# Patient Record
Sex: Female | Born: 1970 | Race: Black or African American | Hispanic: No | Smoking: Never smoker
Health system: Southern US, Community
[De-identification: ages and names within clinical notes are randomized; demographics above are authoritative.]

## PROBLEM LIST (undated history)

## (undated) DIAGNOSIS — I10 Essential (primary) hypertension: Secondary | ICD-10-CM

## (undated) DIAGNOSIS — D649 Anemia, unspecified: Secondary | ICD-10-CM

## (undated) HISTORY — PX: CARPAL TUNNEL RELEASE: SHX101

## (undated) HISTORY — PX: ABDOMINAL HYSTERECTOMY: SHX81

---

## 2009-03-14 ENCOUNTER — Inpatient Hospital Stay (HOSPITAL_COMMUNITY): Admission: AD | Admit: 2009-03-14 | Discharge: 2009-03-14 | Payer: Self-pay | Admitting: Obstetrics and Gynecology

## 2009-05-07 ENCOUNTER — Ambulatory Visit: Payer: Self-pay | Admitting: Obstetrics and Gynecology

## 2009-05-08 ENCOUNTER — Encounter: Payer: Self-pay | Admitting: Family

## 2009-05-08 ENCOUNTER — Ambulatory Visit (HOSPITAL_COMMUNITY): Admission: RE | Admit: 2009-05-08 | Discharge: 2009-05-08 | Payer: Self-pay | Admitting: Family Medicine

## 2009-05-08 ENCOUNTER — Ambulatory Visit: Payer: Self-pay | Admitting: Obstetrics and Gynecology

## 2009-05-08 LAB — CONVERTED CEMR LAB
HCT: 32.2 % — ABNORMAL LOW (ref 36.0–46.0)
Hemoglobin: 9.7 g/dL — ABNORMAL LOW (ref 12.0–15.0)
MCHC: 30.1 g/dL (ref 30.0–36.0)
MCV: 78.5 fL (ref 78.0–100.0)

## 2009-06-03 ENCOUNTER — Ambulatory Visit: Payer: Self-pay | Admitting: Obstetrics and Gynecology

## 2009-06-03 ENCOUNTER — Ambulatory Visit (HOSPITAL_COMMUNITY): Admission: RE | Admit: 2009-06-03 | Discharge: 2009-06-03 | Payer: Self-pay | Admitting: Obstetrics and Gynecology

## 2009-06-20 ENCOUNTER — Ambulatory Visit: Payer: Self-pay | Admitting: Obstetrics and Gynecology

## 2009-08-25 ENCOUNTER — Ambulatory Visit: Payer: Self-pay | Admitting: Physician Assistant

## 2009-08-25 ENCOUNTER — Inpatient Hospital Stay (HOSPITAL_COMMUNITY): Admission: AD | Admit: 2009-08-25 | Discharge: 2009-08-25 | Payer: Self-pay | Admitting: Obstetrics & Gynecology

## 2009-09-19 ENCOUNTER — Encounter: Payer: Self-pay | Admitting: Family

## 2009-09-19 ENCOUNTER — Ambulatory Visit: Payer: Self-pay | Admitting: Obstetrics and Gynecology

## 2009-09-19 LAB — CONVERTED CEMR LAB
RDW: 16.4 % — ABNORMAL HIGH (ref 11.5–15.5)
WBC: 4.1 10*3/uL (ref 4.0–10.5)

## 2009-12-05 ENCOUNTER — Ambulatory Visit: Payer: Self-pay | Admitting: Obstetrics & Gynecology

## 2010-02-20 ENCOUNTER — Ambulatory Visit: Payer: Self-pay | Admitting: Obstetrics and Gynecology

## 2010-04-09 ENCOUNTER — Ambulatory Visit (HOSPITAL_COMMUNITY): Admission: RE | Admit: 2010-04-09 | Discharge: 2010-04-09 | Payer: Self-pay | Admitting: Family Medicine

## 2010-05-08 ENCOUNTER — Ambulatory Visit: Payer: Self-pay | Admitting: Obstetrics & Gynecology

## 2010-07-31 ENCOUNTER — Ambulatory Visit
Admission: RE | Admit: 2010-07-31 | Discharge: 2010-07-31 | Payer: Self-pay | Source: Home / Self Care | Attending: Obstetrics & Gynecology | Admitting: Obstetrics & Gynecology

## 2010-09-07 ENCOUNTER — Encounter (HOSPITAL_COMMUNITY)
Admission: RE | Admit: 2010-09-07 | Discharge: 2010-09-07 | Disposition: A | Discharge status: Home / Self Care | Payer: Self-pay | Source: Ambulatory Visit | Attending: Obstetrics & Gynecology | Admitting: Obstetrics & Gynecology

## 2010-09-07 DIAGNOSIS — Z01810 Encounter for preprocedural cardiovascular examination: Secondary | ICD-10-CM | POA: Insufficient documentation

## 2010-09-07 DIAGNOSIS — Z01812 Encounter for preprocedural laboratory examination: Secondary | ICD-10-CM | POA: Insufficient documentation

## 2010-09-07 LAB — BASIC METABOLIC PANEL
CO2: 29 mEq/L (ref 19–32)
Creatinine, Ser: 1.13 mg/dL (ref 0.4–1.2)
Glucose, Bld: 83 mg/dL (ref 70–99)
Potassium: 3.1 mEq/L — ABNORMAL LOW (ref 3.5–5.1)

## 2010-09-07 LAB — CBC
HCT: 40 % (ref 36.0–46.0)
MCH: 27.6 pg (ref 26.0–34.0)
MCHC: 32 g/dL (ref 30.0–36.0)
Platelets: 159 10*3/uL (ref 150–400)
RBC: 4.64 MIL/uL (ref 3.87–5.11)
RDW: 14.1 % (ref 11.5–15.5)
WBC: 3.6 10*3/uL — ABNORMAL LOW (ref 4.0–10.5)

## 2010-09-07 LAB — SURGICAL PCR SCREEN: MRSA, PCR: NEGATIVE

## 2010-09-14 ENCOUNTER — Other Ambulatory Visit: Payer: Self-pay | Admitting: Obstetrics & Gynecology

## 2010-09-14 ENCOUNTER — Inpatient Hospital Stay (HOSPITAL_COMMUNITY)
Admission: RE | Admit: 2010-09-14 | Discharge: 2010-09-16 | DRG: 743 | Disposition: A | Discharge status: Home Health | Payer: Medicaid Other | Source: Ambulatory Visit | Attending: Obstetrics & Gynecology | Admitting: Obstetrics & Gynecology

## 2010-09-14 DIAGNOSIS — N949 Unspecified condition associated with female genital organs and menstrual cycle: Secondary | ICD-10-CM

## 2010-09-14 DIAGNOSIS — D252 Subserosal leiomyoma of uterus: Secondary | ICD-10-CM

## 2010-09-14 DIAGNOSIS — N803 Endometriosis of pelvic peritoneum, unspecified: Principal | ICD-10-CM | POA: Diagnosis present

## 2010-09-14 DIAGNOSIS — N8300 Follicular cyst of ovary, unspecified side: Secondary | ICD-10-CM | POA: Diagnosis present

## 2010-09-14 DIAGNOSIS — D251 Intramural leiomyoma of uterus: Secondary | ICD-10-CM | POA: Diagnosis present

## 2010-09-14 DIAGNOSIS — D250 Submucous leiomyoma of uterus: Secondary | ICD-10-CM | POA: Diagnosis present

## 2010-09-15 LAB — CBC
MCH: 28.1 pg (ref 26.0–34.0)
MCHC: 32.8 g/dL (ref 30.0–36.0)
RBC: 3.38 MIL/uL — ABNORMAL LOW (ref 3.87–5.11)
WBC: 7.7 10*3/uL (ref 4.0–10.5)

## 2010-10-07 LAB — WET PREP, GENITAL: Clue Cells Wet Prep HPF POC: NONE SEEN

## 2010-10-07 LAB — URINALYSIS, ROUTINE W REFLEX MICROSCOPIC
Glucose, UA: NEGATIVE mg/dL
Ketones, ur: NEGATIVE mg/dL
Nitrite: NEGATIVE
Specific Gravity, Urine: <1.005 — ABNORMAL LOW (ref 1.005–1.030)

## 2010-10-07 LAB — CBC
Hemoglobin: 8.8 g/dL — ABNORMAL LOW (ref 12.0–15.0)
MCHC: 32.2 g/dL (ref 30.0–36.0)
RBC: 3.41 MIL/uL — ABNORMAL LOW (ref 3.87–5.11)

## 2010-10-07 LAB — URINE MICROSCOPIC-ADD ON

## 2010-10-07 LAB — POCT PREGNANCY, URINE: Preg Test, Ur: NEGATIVE

## 2010-10-15 ENCOUNTER — Ambulatory Visit: Payer: Self-pay | Admitting: Physician Assistant

## 2010-10-15 ENCOUNTER — Ambulatory Visit: Payer: Self-pay | Admitting: Obstetrics & Gynecology

## 2010-10-15 DIAGNOSIS — Z09 Encounter for follow-up examination after completed treatment for conditions other than malignant neoplasm: Secondary | ICD-10-CM

## 2010-10-16 NOTE — Op Note (Signed)
Sandra Cunningham, Sandra Cunningham               ACCOUNT NO.:  [id number]  MEDICAL RECORD NO.:  [id number]           PATIENT TYPE:  I  LOCATION:  9319                          FACILITY:  [facility]  PHYSICIAN:  Allie Bossier, MD        DATE OF BIRTH:  1970-11-01  DATE OF PROCEDURE:  09/14/2010 DATE OF DISCHARGE:                              OPERATIVE REPORT   PREOPERATIVE DIAGNOSIS:  Endometriosis and chronic pelvic pain.  POSTOPERATIVE DIAGNOSIS:  Endometriosis and chronic pelvic pain.  PROCEDURE:  TAH left salpingo-oophorectomy.  SURGEON:  Sun Wilensky C. Marice Potter, MD  ASSISTANT:  Scheryl Darter, MD  ANESTHESIA:  GETA.  COMPLICATIONS:  None.  ESTIMATED BLOOD LOSS:  Less than 200 mL.  SPECIMENS:  Uterus with cervix and left ovary and tube.  FINDINGS:  Normal pelvis with the exception of some moderately dense bladder flap adhesions, absence of right adnexa.  DETAILED PROCEDURE AND FINDINGS:  Risks, benefits, alternatives of surgery were explained, understood, and accepted.  Consents were signed. She was taken to the operating room, general anesthesia was applied. Her vagina and abdomen were prepped and draped in the usual sterile fashion.  Foley catheter was placed which drained clear urine throughout the case.  A transverse incision was made at the site of her previous laparotomy.  Incision was carried down through the subcutaneous tissue of the fascia.  Fascia was scored in the midline.  Fascial incision was extended bilaterally and curved slightly upwards.  The middle 50% of the rectus muscles were separated in transverse fashion using electrosurgical technique.  Excellent hemostasis was maintained.  The peritoneum was entered with hemostats.  Peritoneal incision was extended with the Bovie bilaterally taking care to avoid the bowel.  The patient was placed in Trendelenburg position and her bowel was packed out of the abdominal cavity and O'Connor-O'Sullivan self-retaining retractor was placed.   Excellent visualization was obtained.  The ureters positions were noted bilaterally.  They were functioning well throughout the case and noted to be well away from the operative site.  The uterus was placed on traction by using Kocher clamps across the uterosacral ligaments bilaterally.  The round ligaments were each identified, clamped, cut, and ligated.  A bladder flap was created anteriorly, I had to do some lysis of adhesions there, but the bladder well taking care of urine in this case.  The utero-ovarian ligament on the right was identified, clamped, cut, and doubly ligated.  Please note that 2-0 Vicryl sutures used throughout this case unless otherwise specified. The left infundibulopelvic ligament was identified.  Ureters position was again noted.  The infundibulopelvic ligament was then clamped, cut, and doubly ligated.  Uterine vessels were skeletonized, clamped, cut, and doubly ligated.  Remainder of the relatively short cervix attachment to the pelvis were separated in a clamp, cut, and ligate technique.  The vagina was entered.  The cervix was removed and noted to be intact.  The vaginal cuff was closed with a 0 Vicryl running locking suture. Excellent hemostasis was noted.  The pelvis was irrigated with a liter of warm normal saline.  There was a very small amount  of oozing from the right ovarian fossa in the adventitia.  There was nothing specific that could be cauterized and it was clearly not going to cause hemodynamic difficulty.  I placed a Gelfoam across the ovarian fossa.  I removed the sponges and self-retaining retractor and closed the fascia with a #1 PDS loop in a running nonlocking fashion.  No defects were palpable.  The subcutaneous tissue was irrigated, cleaned, and dried.  It was then infiltrated with 30 mL of 0.5% Marcaine.  A subcuticular closure was done with 3-0 Vicryl suture.  Her Foley catheter drained clear urine throughout.  She was extubated and taken  to recovery room in stable condition.     Allie Bossier, MD     MCD/MEDQ  D:  09/14/2010  T:  09/15/2010  Job:  [id number]  Electronically Signed by Nicholaus Bloom MD on 10/16/2010 11:26:01 AM

## 2010-10-21 LAB — BASIC METABOLIC PANEL
BUN: 14 mg/dL (ref 6–23)
CO2: 29 mEq/L (ref 19–32)
Calcium: 8.6 mg/dL (ref 8.4–10.5)
Creatinine, Ser: 0.84 mg/dL (ref 0.4–1.2)
GFR calc Af Amer: >60 mL/min (ref >60)
GFR calc non Af Amer: >60 mL/min (ref >60)

## 2010-10-21 LAB — CBC
HCT: 31.5 % — ABNORMAL LOW (ref 36.0–46.0)
MCHC: 32.2 g/dL (ref 30.0–36.0)
Platelets: 195 10*3/uL (ref 150–400)
RBC: 4.01 MIL/uL (ref 3.87–5.11)
WBC: 3.5 10*3/uL — ABNORMAL LOW (ref 4.0–10.5)

## 2010-10-24 LAB — CBC
HCT: 27.4 % — ABNORMAL LOW (ref 36.0–46.0)
Hemoglobin: 9 g/dL — ABNORMAL LOW (ref 12.0–15.0)
MCV: 80.9 fL (ref 78.0–100.0)
RBC: 3.39 MIL/uL — ABNORMAL LOW (ref 3.87–5.11)
RDW: 16.8 % — ABNORMAL HIGH (ref 11.5–15.5)

## 2010-10-24 LAB — URINALYSIS, ROUTINE W REFLEX MICROSCOPIC
Hgb urine dipstick: NEGATIVE
Ketones, ur: NEGATIVE mg/dL
Nitrite: NEGATIVE
Urobilinogen, UA: 1 mg/dL (ref 0.0–1.0)

## 2010-10-24 LAB — POCT PREGNANCY, URINE: Preg Test, Ur: NEGATIVE

## 2010-10-24 LAB — URINE MICROSCOPIC-ADD ON

## 2010-10-24 LAB — URINE CULTURE

## 2010-10-24 LAB — RAPID URINE DRUG SCREEN, HOSP PERFORMED: Barbiturates: NOT DETECTED

## 2010-12-23 ENCOUNTER — Ambulatory Visit: Payer: Self-pay | Admitting: Advanced Practice Midwife

## 2011-01-06 ENCOUNTER — Ambulatory Visit: Payer: Self-pay | Admitting: Family Medicine

## 2011-07-21 ENCOUNTER — Encounter: Payer: Self-pay | Admitting: *Deleted

## 2011-07-21 ENCOUNTER — Emergency Department (HOSPITAL_BASED_OUTPATIENT_CLINIC_OR_DEPARTMENT_OTHER)
Admission: EM | Admit: 2011-07-21 | Discharge: 2011-07-21 | Disposition: A | Discharge status: Home / Self Care | Payer: Self-pay | Attending: Emergency Medicine | Admitting: Emergency Medicine

## 2011-07-21 DIAGNOSIS — M5430 Sciatica, unspecified side: Secondary | ICD-10-CM | POA: Insufficient documentation

## 2011-07-21 DIAGNOSIS — I10 Essential (primary) hypertension: Secondary | ICD-10-CM | POA: Insufficient documentation

## 2011-07-21 DIAGNOSIS — M549 Dorsalgia, unspecified: Secondary | ICD-10-CM | POA: Insufficient documentation

## 2011-07-21 HISTORY — DX: Essential (primary) hypertension: I10

## 2011-07-21 HISTORY — DX: Anemia, unspecified: D64.9

## 2011-07-21 MED ORDER — CYCLOBENZAPRINE HCL 5 MG PO TABS: 5.0000 mg | ORAL_TABLET | Freq: Two times a day (BID) | ORAL | Status: AC | PRN

## 2011-07-21 MED ORDER — HYDROCODONE-ACETAMINOPHEN 5-500 MG PO TABS: 1.0000 | ORAL_TABLET | Freq: Four times a day (QID) | ORAL | Status: AC | PRN

## 2011-07-21 NOTE — ED Notes (Signed)
Low back pain onset 2 months ago thinks she may have pulled a muscle then but is getting worse now has pain radiating into buttocks and feeling of pins and needles into legs

## 2011-07-21 NOTE — ED Provider Notes (Signed)
History     CSN: [id number]  Arrival date & time 07/21/11  4098   First MD Initiated Contact with Patient 07/21/11 1955      Chief Complaint  Patient presents with  . Back Pain    (Consider location/radiation/quality/duration/timing/severity/associated sxs/prior treatment) HPI Comments: Pt state that she has pain in her right buttocks that radiates down her leg:pt denies any know injury  Patient is a 41 y.o. female presenting with back pain. The history is provided by the patient. No language interpreter was used.  Back Pain  This is a new problem. The current episode started more than 1 week ago. The problem occurs daily. The problem has not changed since onset.The pain is associated with no known injury. The pain is present in the lumbar spine. The quality of the pain is described as shooting. The pain radiates to the right thigh. The pain is severe. The symptoms are aggravated by certain positions. The pain is worse during the day. Pertinent negatives include no numbness, no bowel incontinence, no perianal numbness, no bladder incontinence, no tingling and no weakness. She has tried nothing for the symptoms.    Past Medical History  Diagnosis Date  . Hypertension   . Anemia     Past Surgical History  Procedure Date  . Abdominal hysterectomy     History reviewed. No pertinent family history.  History  Substance Use Topics  . Smoking status: Never Smoker   . Smokeless tobacco: Not on file  . Alcohol Use: No    OB History    Grav Para Term Preterm Abortions TAB SAB Ect Mult Living                  Review of Systems  Gastrointestinal: Negative for bowel incontinence.  Genitourinary: Negative for bladder incontinence.  Musculoskeletal: Positive for back pain.  Neurological: Negative for tingling, weakness and numbness.  All other systems reviewed and are negative.    Allergies  Review of patient's allergies indicates no known allergies.  Home Medications    Current Outpatient Rx  Name Route Sig Dispense Refill  . ESOMEPRAZOLE MAGNESIUM 40 MG PO CPDR Oral Take 40 mg by mouth 2 (two) times daily.      Marland Kitchen HYDROCHLOROTHIAZIDE 25 MG PO TABS Oral Take 25 mg by mouth daily.      . IBUPROFEN 200 MG PO TABS Oral Take 800 mg by mouth daily.      Marland Kitchen LISINOPRIL 5 MG PO TABS Oral Take 5 mg by mouth daily.        BP 160/106  Pulse 70  Temp(Src) 98.6 F (37 C) (Oral)  Resp 20  SpO2 100%  Physical Exam  Nursing note and vitals reviewed. Constitutional: She is oriented to person, place, and time. She appears well-developed and well-nourished.  HENT:  Head: Normocephalic and atraumatic.  Cardiovascular: Normal rate and regular rhythm.   Pulmonary/Chest: Effort normal and breath sounds normal.  Musculoskeletal: Normal range of motion.       Pt has right sciatic notch tenderness  Neurological: She is alert and oriented to person, place, and time. No cranial nerve deficit. She exhibits normal muscle tone. Coordination normal.  Skin: Skin is warm and dry.    ED Course  Procedures (including critical care time)  Labs Reviewed - No data to display No results found.   1. Sciatica       MDM  Pt not having any neuro deficit will treat symptomatically  Teressa Lower, NP 07/21/11 2025

## 2011-07-21 NOTE — ED Provider Notes (Signed)
Medical screening examination/treatment/procedure(s) were performed by non-physician practitioner and as supervising physician I was immediately available for consultation/collaboration.   Aveyah Greenwood, MD 07/21/11 2306 

## 2013-04-16 ENCOUNTER — Emergency Department (HOSPITAL_BASED_OUTPATIENT_CLINIC_OR_DEPARTMENT_OTHER)
Admission: EM | Admit: 2013-04-16 | Discharge: 2013-04-16 | Disposition: A | Discharge status: Home / Self Care | Payer: Medicaid Other | Attending: Emergency Medicine | Admitting: Emergency Medicine

## 2013-04-16 ENCOUNTER — Encounter (HOSPITAL_BASED_OUTPATIENT_CLINIC_OR_DEPARTMENT_OTHER): Payer: Self-pay

## 2013-04-16 DIAGNOSIS — I10 Essential (primary) hypertension: Secondary | ICD-10-CM | POA: Insufficient documentation

## 2013-04-16 DIAGNOSIS — M545 Low back pain, unspecified: Secondary | ICD-10-CM | POA: Insufficient documentation

## 2013-04-16 DIAGNOSIS — Z862 Personal history of diseases of the blood and blood-forming organs and certain disorders involving the immune mechanism: Secondary | ICD-10-CM | POA: Insufficient documentation

## 2013-04-16 DIAGNOSIS — Z79899 Other long term (current) drug therapy: Secondary | ICD-10-CM | POA: Insufficient documentation

## 2013-04-16 DIAGNOSIS — M549 Dorsalgia, unspecified: Secondary | ICD-10-CM

## 2013-04-16 MED ORDER — MELOXICAM 7.5 MG PO TABS: 7.5000 mg | ORAL_TABLET | Freq: Every day | ORAL | Status: DC

## 2013-04-16 MED ORDER — PREDNISONE 10 MG PO TABS: ORAL_TABLET | ORAL | Status: DC

## 2013-04-16 MED ORDER — HYDROCODONE-ACETAMINOPHEN 5-325 MG PO TABS: 2.0000 | ORAL_TABLET | ORAL | Status: DC | PRN

## 2013-04-16 NOTE — ED Notes (Signed)
Pt has back pain x 2 months and states back pain has increased the past 2 days.

## 2013-04-16 NOTE — ED Provider Notes (Signed)
CSN: [id number]     Arrival date & time 04/16/13  1744 History   First MD Initiated Contact with Patient 04/16/13 1939     Chief Complaint  Patient presents with  . Back Pain   (Consider location/radiation/quality/duration/timing/severity/associated sxs/prior Treatment) Patient is a 42 y.o. female presenting with back pain. The history is provided by the patient. No language interpreter was used.  Back Pain Location:  Lumbar spine Quality:  Aching Pain severity:  Moderate Onset quality:  Sudden Timing:  Constant Progression:  Worsening Chronicity:  New Relieved by:  Nothing Worsened by:  Nothing tried Ineffective treatments:  None tried Associated symptoms: no abdominal pain     Past Medical History  Diagnosis Date  . Hypertension   . Anemia    Past Surgical History  Procedure Laterality Date  . Abdominal hysterectomy     No family history on file. History  Substance Use Topics  . Smoking status: Never Smoker   . Smokeless tobacco: Not on file  . Alcohol Use: No   OB History   Grav Para Term Preterm Abortions TAB SAB Ect Mult Living                 Review of Systems  Gastrointestinal: Negative for abdominal pain.  Musculoskeletal: Positive for back pain.  All other systems reviewed and are negative.    Allergies  Review of patient's allergies indicates no known allergies.  Home Medications   Current Outpatient Rx  Name  Route  Sig  Dispense  Refill  . hydrochlorothiazide (HYDRODIURIL) 25 MG tablet   Oral   Take 25 mg by mouth daily.         Marland Kitchen omeprazole (PRILOSEC) 20 MG capsule   Oral   Take 20 mg by mouth daily.          BP 167/101  Pulse 70  Temp(Src) 99 F (37.2 C) (Oral)  Resp 17  Ht 5\' 5"  (1.651 m)  Wt 194 lb (87.998 kg)  BMI 32.28 kg/m2  SpO2 100% Physical Exam  Nursing note and vitals reviewed. Constitutional: She appears well-developed and well-nourished.  Cardiovascular: Normal rate.   Pulmonary/Chest: Effort normal.   Abdominal: Soft.  Musculoskeletal: Normal range of motion.  Neurological: She is alert.  Skin: Skin is warm.  Psychiatric: She has a normal mood and affect.    ED Course  Procedures (including critical care time) Labs Review Labs Reviewed - No data to display Imaging Review No results found.  MDM   1. Back pain    Meloxicam,  Prednisone and hydrocodone.   Pt advised to follow up with Dr. Pearletha Forge for recheck   Elson Areas, PA-C 04/17/13 0040

## 2013-04-17 NOTE — ED Provider Notes (Signed)
History/physical exam/procedure(s) were performed by non-physician practitioner and as supervising physician I was immediately available for consultation/collaboration. I have reviewed all notes and am in agreement with care and plan.   Hilario Quarry, MD 04/17/13 [phone]

## 2014-04-23 ENCOUNTER — Encounter (HOSPITAL_BASED_OUTPATIENT_CLINIC_OR_DEPARTMENT_OTHER): Payer: Self-pay | Admitting: Emergency Medicine

## 2014-04-23 ENCOUNTER — Emergency Department (HOSPITAL_BASED_OUTPATIENT_CLINIC_OR_DEPARTMENT_OTHER)
Admission: EM | Admit: 2014-04-23 | Discharge: 2014-04-24 | Disposition: A | Discharge status: Home / Self Care | Payer: No Typology Code available for payment source | Attending: Emergency Medicine | Admitting: Emergency Medicine

## 2014-04-23 ENCOUNTER — Emergency Department (HOSPITAL_BASED_OUTPATIENT_CLINIC_OR_DEPARTMENT_OTHER): Payer: No Typology Code available for payment source

## 2014-04-23 DIAGNOSIS — Z791 Long term (current) use of non-steroidal anti-inflammatories (NSAID): Secondary | ICD-10-CM | POA: Diagnosis not present

## 2014-04-23 DIAGNOSIS — Y9389 Activity, other specified: Secondary | ICD-10-CM | POA: Insufficient documentation

## 2014-04-23 DIAGNOSIS — Z79899 Other long term (current) drug therapy: Secondary | ICD-10-CM | POA: Diagnosis not present

## 2014-04-23 DIAGNOSIS — I10 Essential (primary) hypertension: Secondary | ICD-10-CM | POA: Insufficient documentation

## 2014-04-23 DIAGNOSIS — S199XXA Unspecified injury of neck, initial encounter: Secondary | ICD-10-CM | POA: Diagnosis present

## 2014-04-23 DIAGNOSIS — Z862 Personal history of diseases of the blood and blood-forming organs and certain disorders involving the immune mechanism: Secondary | ICD-10-CM | POA: Diagnosis not present

## 2014-04-23 DIAGNOSIS — S3982XA Other specified injuries of lower back, initial encounter: Secondary | ICD-10-CM | POA: Diagnosis not present

## 2014-04-23 DIAGNOSIS — S139XXA Sprain of joints and ligaments of unspecified parts of neck, initial encounter: Secondary | ICD-10-CM | POA: Insufficient documentation

## 2014-04-23 DIAGNOSIS — E041 Nontoxic single thyroid nodule: Secondary | ICD-10-CM | POA: Insufficient documentation

## 2014-04-23 DIAGNOSIS — Y92410 Unspecified street and highway as the place of occurrence of the external cause: Secondary | ICD-10-CM | POA: Diagnosis not present

## 2014-04-23 DIAGNOSIS — Z7952 Long term (current) use of systemic steroids: Secondary | ICD-10-CM | POA: Diagnosis not present

## 2014-04-23 MED ORDER — KETOROLAC TROMETHAMINE 60 MG/2ML IM SOLN
60.0000 mg | Freq: Once | INTRAMUSCULAR | Status: AC
  Administered 2014-04-23: 60 mg via INTRAMUSCULAR
  Filled 2014-04-23: qty 2
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
## 2014-04-23 NOTE — ED Notes (Signed)
MD at bedside. 

## 2014-04-24 MED ORDER — HYDROCODONE-ACETAMINOPHEN 5-325 MG PO TABS: 1.0000 | ORAL_TABLET | Freq: Four times a day (QID) | ORAL | Status: DC | PRN
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
## 2016-05-30 ENCOUNTER — Emergency Department (HOSPITAL_BASED_OUTPATIENT_CLINIC_OR_DEPARTMENT_OTHER)
Admission: EM | Admit: 2016-05-30 | Discharge: 2016-05-30 | Disposition: A | Discharge status: Home / Self Care | Payer: BLUE CROSS/BLUE SHIELD | Attending: Emergency Medicine | Admitting: Emergency Medicine

## 2016-05-30 ENCOUNTER — Encounter (HOSPITAL_BASED_OUTPATIENT_CLINIC_OR_DEPARTMENT_OTHER): Payer: Self-pay | Admitting: *Deleted

## 2016-05-30 ENCOUNTER — Emergency Department (HOSPITAL_BASED_OUTPATIENT_CLINIC_OR_DEPARTMENT_OTHER): Payer: BLUE CROSS/BLUE SHIELD

## 2016-05-30 DIAGNOSIS — R05 Cough: Secondary | ICD-10-CM | POA: Insufficient documentation

## 2016-05-30 DIAGNOSIS — Z79899 Other long term (current) drug therapy: Secondary | ICD-10-CM | POA: Diagnosis not present

## 2016-05-30 DIAGNOSIS — I10 Essential (primary) hypertension: Secondary | ICD-10-CM | POA: Diagnosis not present

## 2016-05-30 DIAGNOSIS — R5383 Other fatigue: Secondary | ICD-10-CM | POA: Insufficient documentation

## 2016-05-30 LAB — URINALYSIS, ROUTINE W REFLEX MICROSCOPIC
Bilirubin Urine: NEGATIVE
Glucose, UA: NEGATIVE mg/dL
HGB URINE DIPSTICK: NEGATIVE
Ketones, ur: NEGATIVE mg/dL
NITRITE: NEGATIVE
PROTEIN: NEGATIVE mg/dL
SPECIFIC GRAVITY, URINE: 1.026 (ref 1.005–1.030)
pH: 7 (ref 5.0–8.0)

## 2016-05-30 LAB — CBC
HCT: 35.6 % — ABNORMAL LOW (ref 36.0–46.0)
Hemoglobin: 11.6 g/dL — ABNORMAL LOW (ref 12.0–15.0)
MCH: 28.3 pg (ref 26.0–34.0)
MCHC: 32.6 g/dL (ref 30.0–36.0)
MCV: 86.8 fL (ref 78.0–100.0)
PLATELETS: 207 10*3/uL (ref 150–400)
RBC: 4.1 MIL/uL (ref 3.87–5.11)
RDW: 15.4 % (ref 11.5–15.5)
WBC: 5 10*3/uL (ref 4.0–10.5)

## 2016-05-30 LAB — URINE MICROSCOPIC-ADD ON

## 2016-05-30 LAB — BASIC METABOLIC PANEL
Anion gap: 6 (ref 5–15)
BUN: 25 mg/dL — ABNORMAL HIGH (ref 6–20)
CALCIUM: 8.7 mg/dL — AB (ref 8.9–10.3)
CO2: 29 mmol/L (ref 22–32)
CREATININE: 1.05 mg/dL — AB (ref 0.44–1.00)
Chloride: 106 mmol/L (ref 101–111)
GFR calc non Af Amer: >60 mL/min (ref >60)
Glucose, Bld: 102 mg/dL — ABNORMAL HIGH (ref 65–99)
Potassium: 3.6 mmol/L (ref 3.5–5.1)
Sodium: 141 mmol/L (ref 135–145)

## 2016-05-30 LAB — PREGNANCY, URINE: PREG TEST UR: NEGATIVE

## 2016-05-30 LAB — CBG MONITORING, ED: Glucose-Capillary: 93 mg/dL (ref 65–99)

## 2016-05-30 NOTE — ED Notes (Signed)
Pt c/o general fatigue x3 days. Pt reports dry cough x 1 month. Pt denies fever, N/V/D. Pt had recent travel to the [place] that included a long car ride to [facility]. Pt also states she had some burning with urination 1 week ago, but has since cleared up.

## 2016-05-30 NOTE — ED Triage Notes (Addendum)
Increased fatigue since Monday.  Requesting to have CBG

## 2016-05-30 NOTE — ED Provider Notes (Signed)
[facility] Provider Note   CSN: [id number] Arrival date & time: 05/30/16  1811 By signing my name below, I, Georgette Shell, attest that this documentation has been prepared under the direction and in the presence of Shary Decamp, PA-C. Electronically Signed: Georgette Shell, ED Scribe. 05/30/16. 8:48 PM.  History   Chief Complaint Chief Complaint  Patient presents with  . Fatigue   HPI Comments: Sandra Cunningham is a 45 y.o. female with h/o anemia and HTN who presents to the Emergency Department complaining of increased fatigue onset 4 days ago. Pt also endorses a dry cough but notes it was already persistent for a couple weeks prior to the onset of her fatigue. She has not tried any OTC medications PTA. Denies h/o similar symptoms. Pt reports she recently drove to [place]. She further denies chest pain, vaginal bleeding/discharge, dysuria, abdominal pain, shortness of breath, nausea, vomiting, diarrhea, or any other associated symptoms.   The history is provided by the patient. No language interpreter was used.   Past Medical History:  Diagnosis Date  . Anemia   . Hypertension    There are no active problems to display for this patient.  Past Surgical History:  Procedure Laterality Date  . ABDOMINAL HYSTERECTOMY    . CARPAL TUNNEL RELEASE    . CESAREAN SECTION      OB History    No data available     Home Medications    Prior to Admission medications   Medication Sig Start Date End Date Taking? Authorizing Provider  amLODipine (NORVASC) 10 MG tablet Take 10 mg by mouth daily.   Yes Historical Provider, MD   Family History History reviewed. No pertinent family history.  Social History Social History  Substance Use Topics  . Smoking status: Never Smoker  . Smokeless tobacco: Not on file  . Alcohol use No   Allergies   Patient has no known allergies.  Review of Systems Review of Systems  Constitutional: Positive for fatigue. Negative for fever.  Respiratory:  Positive for cough. Negative for shortness of breath.   Cardiovascular: Negative for chest pain.  Gastrointestinal: Negative for abdominal pain, diarrhea, nausea and vomiting.  Genitourinary: Negative for dysuria, vaginal bleeding and vaginal discharge.  All other systems reviewed and are negative.  Physical Exam Updated Vital Signs BP 143/85 (BP Location: Left Arm)   Pulse 70   Temp 98.3 F (36.8 C) (Oral)   Resp 18   Ht 5\' 5"  (1.651 m)   Wt 190 lb (86.2 kg)   SpO2 100%   BMI 31.62 kg/m   Physical Exam  Constitutional: She is oriented to person, place, and time. She appears well-developed and well-nourished. No distress.  HENT:  Head: Normocephalic and atraumatic.  Nose: Nose normal.  Mouth/Throat: Oropharynx is clear and moist. No oropharyngeal exudate.  Eyes: Conjunctivae and EOM are normal. Pupils are equal, round, and reactive to light. No scleral icterus.  Neck: Normal range of motion. Neck supple. No JVD present. No tracheal deviation present. No thyromegaly present.  Cardiovascular: Normal rate, regular rhythm and normal heart sounds.  Exam reveals no gallop and no friction rub.   No murmur heard. Pulmonary/Chest: Effort normal and breath sounds normal. No respiratory distress. She has no wheezes. She exhibits no tenderness.  Abdominal: Soft. Bowel sounds are normal. She exhibits no distension and no mass. There is no tenderness. There is no rebound and no guarding.  Musculoskeletal: Normal range of motion. She exhibits no edema or tenderness.  Lymphadenopathy:  She has no cervical adenopathy.  Neurological: She is alert and oriented to person, place, and time. No cranial nerve deficit. She exhibits normal muscle tone.  Skin: Skin is warm and dry. No rash noted. No erythema. No pallor.  Nursing note and vitals reviewed.  ED Treatments / Results  DIAGNOSTIC STUDIES: Oxygen Saturation is 100% on RA, normal by my interpretation.    COORDINATION OF CARE: 8:45 PM  Discussed treatment plan with pt at bedside which includes blood work and pt agreed to plan.  Labs (all labs ordered are listed, but only abnormal results are displayed) Labs Reviewed  BASIC METABOLIC PANEL - Abnormal; Notable for the following:       Result Value   Glucose, Bld 102 (*)    BUN 25 (*)    Creatinine, Ser 1.05 (*)    Calcium 8.7 (*)    All other components within normal limits  CBC - Abnormal; Notable for the following:    Hemoglobin 11.6 (*)    HCT 35.6 (*)    All other components within normal limits  URINALYSIS, ROUTINE W REFLEX MICROSCOPIC (NOT AT [facility]) - Abnormal; Notable for the following:    APPearance CLOUDY (*)    Leukocytes, UA SMALL (*)    All other components within normal limits  URINE MICROSCOPIC-ADD ON - Abnormal; Notable for the following:    Squamous Epithelial / LPF 6-30 (*)    Bacteria, UA MANY (*)    All other components within normal limits  PREGNANCY, URINE  CBG MONITORING, ED   EKG  EKG Interpretation None      Radiology Dg Chest 2 View  Result Date: 05/30/2016 CLINICAL DATA:  Fatigue and intermittent nonproductive cough for 1 month. EXAM: CHEST  2 VIEW COMPARISON:  None. FINDINGS: The heart size and mediastinal contours are within normal limits. Both lungs are clear. The visualized skeletal structures are unremarkable. IMPRESSION: No active cardiopulmonary disease. Electronically Signed   By: Andreas Newport M.D.   On: 05/30/2016 21:12    Procedures Procedures (including critical care time)  Medications Ordered in ED Medications - No data to display   Initial Impression / Assessment and Plan / ED Course  I have reviewed the triage vital signs and the nursing notes.  Pertinent labs & imaging results that were available during my care of the patient were reviewed by me and considered in my medical decision making (see chart for details).  Clinical Course    Final Clinical Impressions(s) / ED Diagnoses  I have reviewed and  evaluated the relevant laboratory values I have reviewed the relevant previous healthcare records. I obtained HPI from historian.  ED Course:  Assessment: Pt is a 45yF who presents with fatigue x 4 days. No symptoms otherwise. No N/V/D. No Fevers. No URI symptoms On exam, pt in NAD. Nontoxic/nonseptic appearing. VSS. Afebrile. Lungs CTA. Heart RRR. Abdomen nontender soft. CBC unreamrkable. BMP unremarkable. UA unremarkable. Pregnancy negative CXR unremarakble. No emergenct medical pathology identified on exam. Possible over exertion for fatigue? Plan is to DC home with follow up to PCP. At time of discharge, Patient is in no acute distress. Vital Signs are stable. Patient is able to ambulate. Patient able to tolerate PO.   Disposition/Plan:  DC Home Additional Verbal discharge instructions given and discussed with patient.  Pt Instructed to f/u with PCP in the next week for evaluation and treatment of symptoms. Return precautions given Pt acknowledges and agrees with plan  Supervising Physician Varney Biles, MD  Final  diagnoses:  Fatigue, unspecified type    New Prescriptions New Prescriptions   No medications on file   I personally performed the services described in this documentation, which was scribed in my presence. The recorded information has been reviewed and is accurate.     Shary Decamp, PA-C 05/30/16 2123    Varney Biles, MD 05/31/16 [phone]
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
## 2016-05-30 NOTE — ED Notes (Signed)
Pt given d/c instructions as per chart. Verbalizes understanding. No questions. 

## 2018-05-20 IMAGING — DX DG CHEST 2V
2 series · 2 of 2 positions shown · non-contrast
Comparison: None.

CLINICAL DATA: Fatigue and intermittent nonproductive cough for 1
month.

EXAM:
CHEST  2 VIEW

[chest pa]
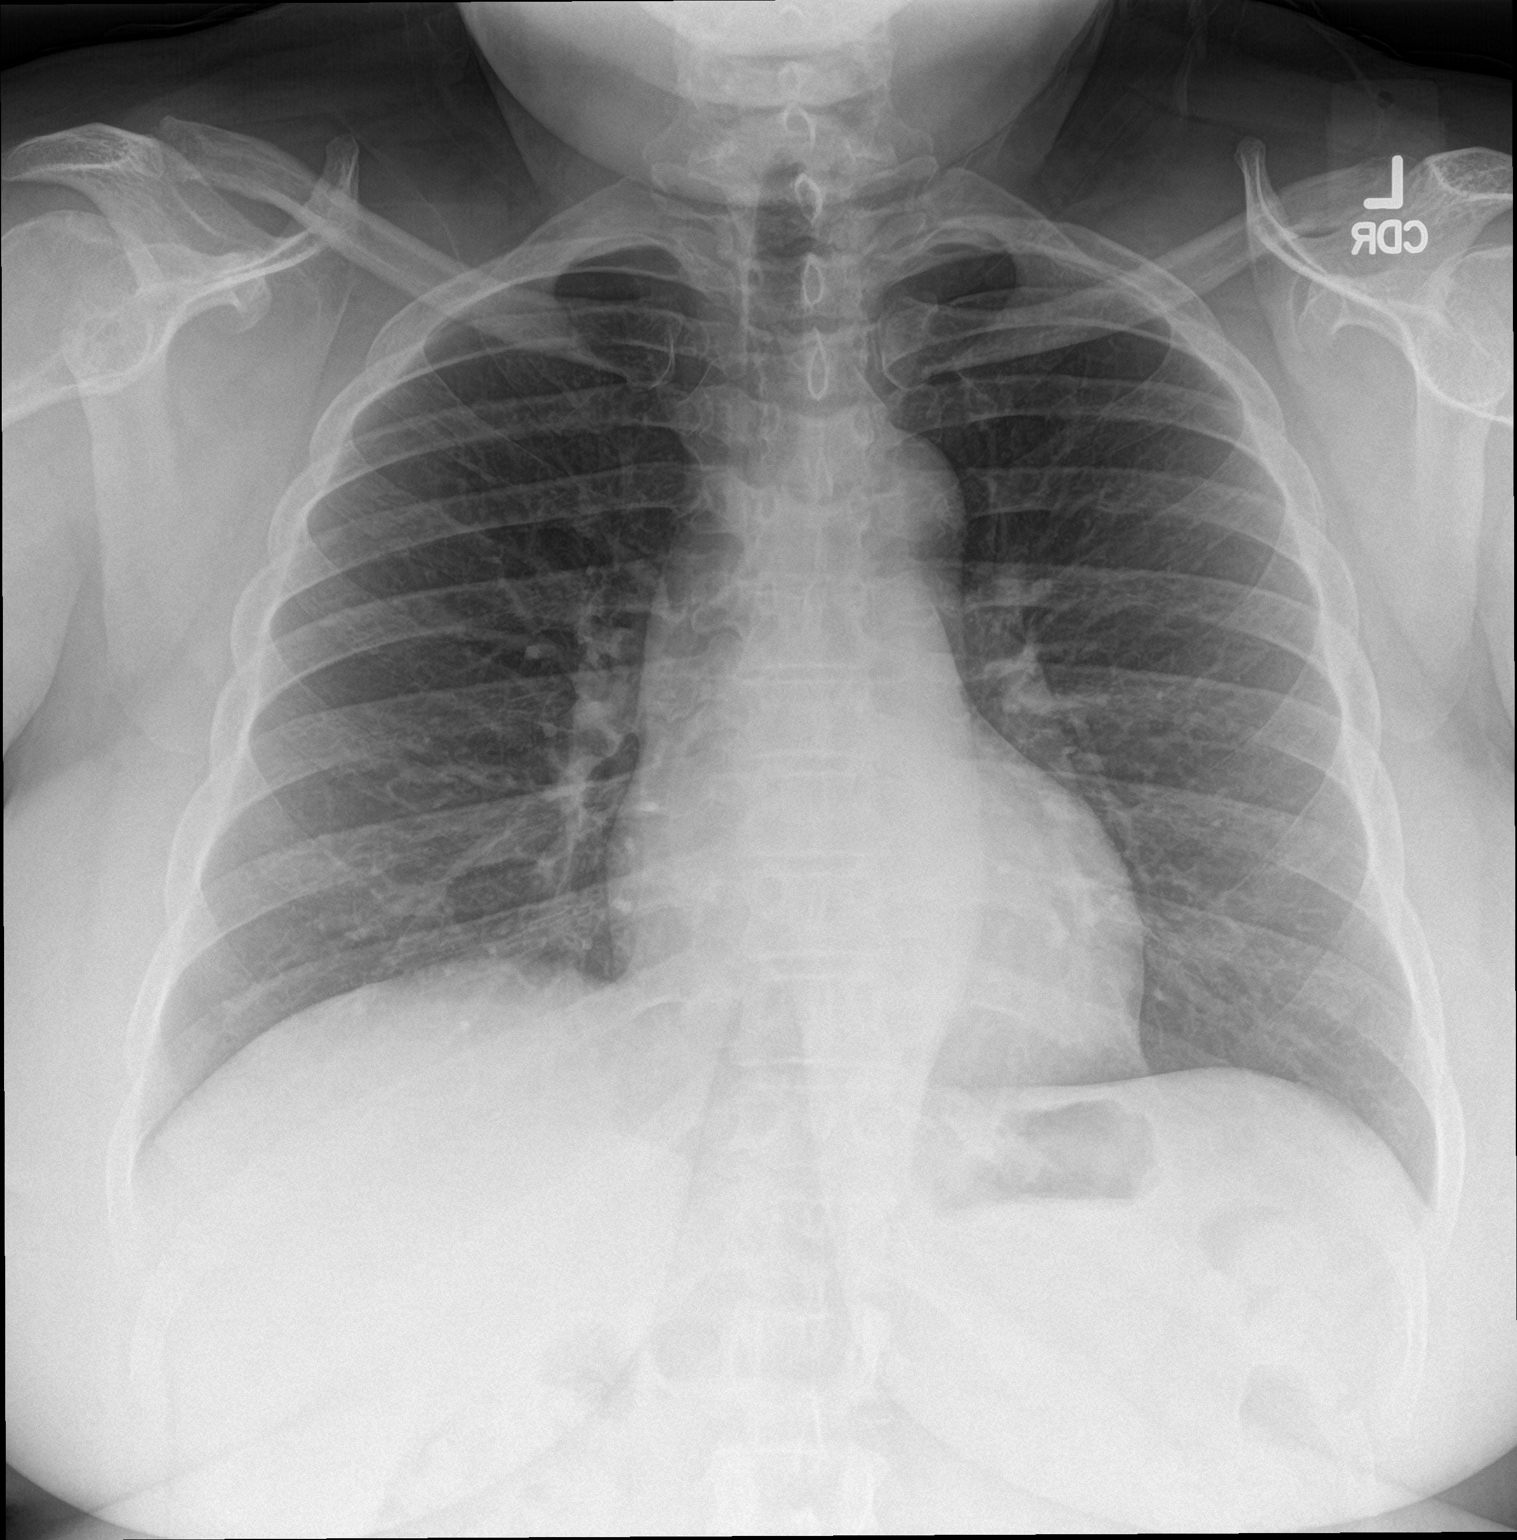

[chest lat]
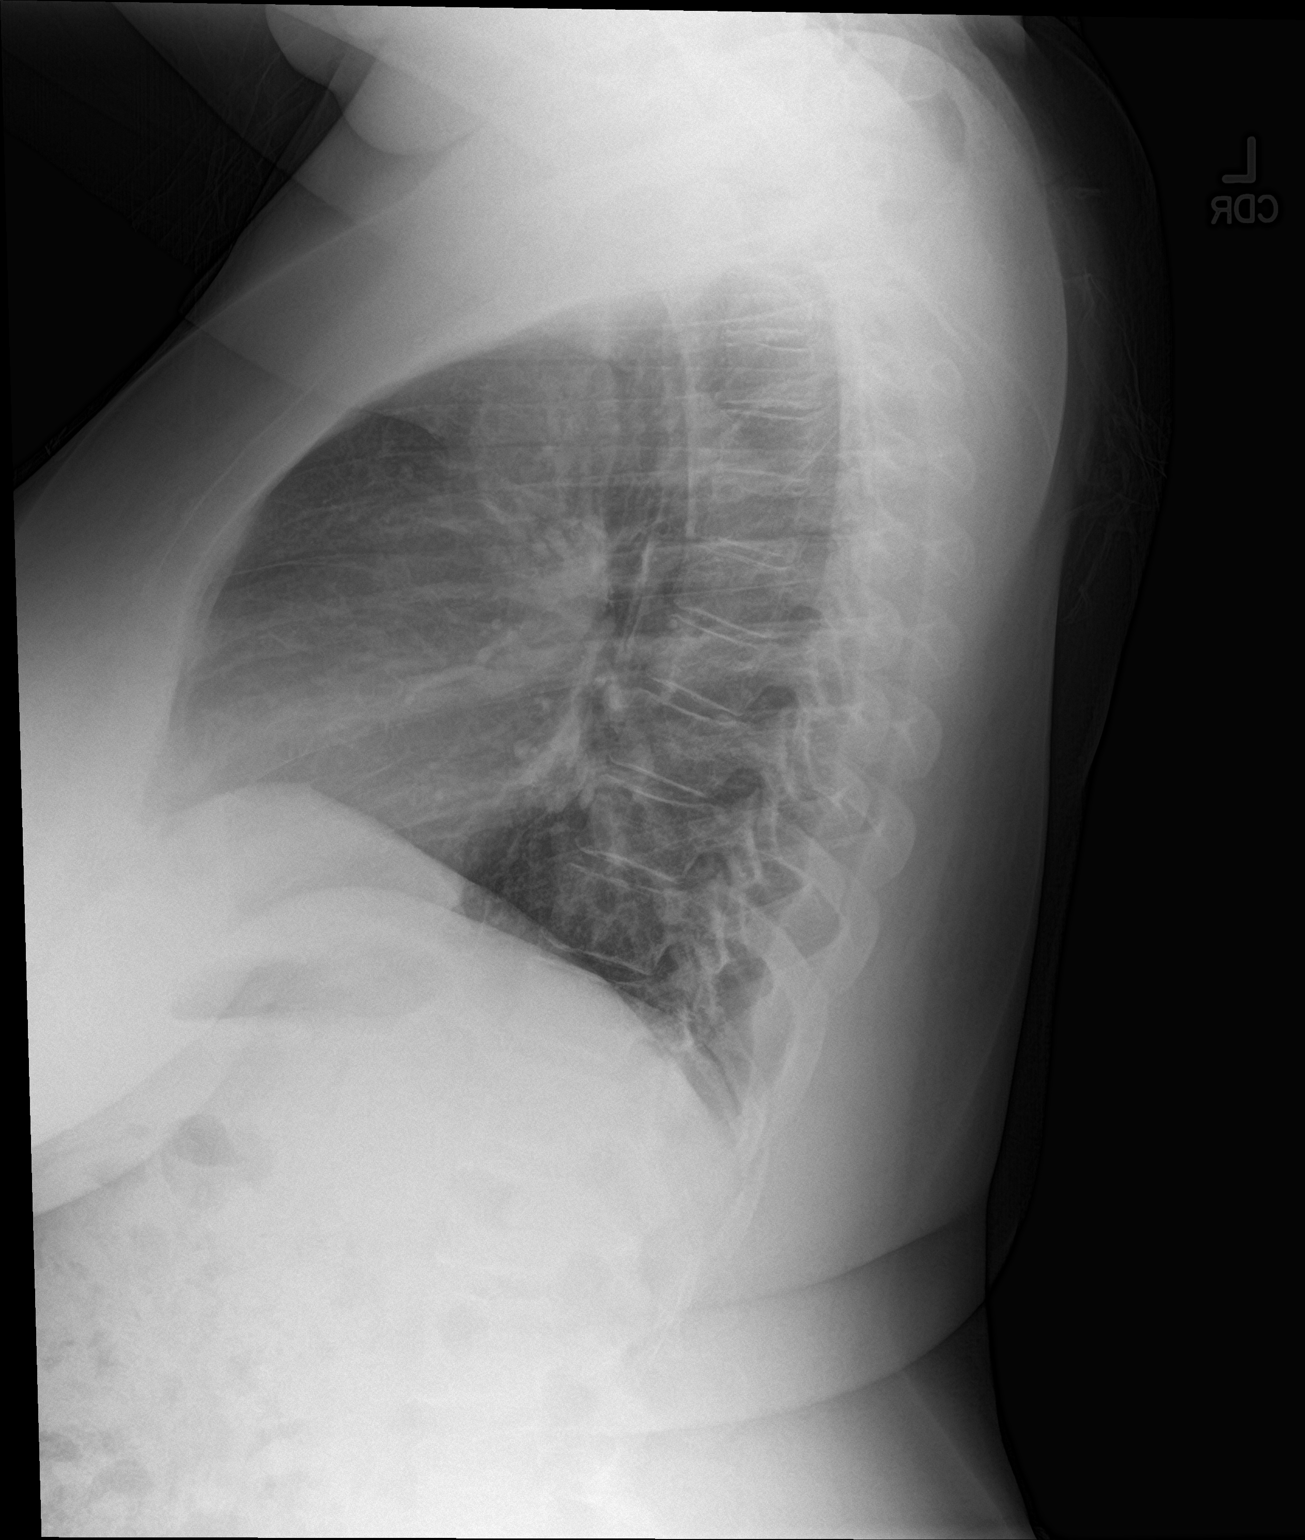

[2 of 2 positions shown; findings below may reference images not displayed]

FINDINGS: The heart size and mediastinal contours are within normal limits.
Both lungs are clear. The visualized skeletal structures are
unremarkable.
IMPRESSION: No active cardiopulmonary disease.

## 2019-05-03 ENCOUNTER — Emergency Department (HOSPITAL_BASED_OUTPATIENT_CLINIC_OR_DEPARTMENT_OTHER)
Admission: EM | Admit: 2019-05-03 | Discharge: 2019-05-03 | Disposition: A | Discharge status: Home / Self Care | Payer: BLUE CROSS/BLUE SHIELD | Attending: Emergency Medicine | Admitting: Emergency Medicine

## 2019-05-03 ENCOUNTER — Other Ambulatory Visit: Payer: Self-pay

## 2019-05-03 ENCOUNTER — Encounter (HOSPITAL_BASED_OUTPATIENT_CLINIC_OR_DEPARTMENT_OTHER): Payer: Self-pay | Admitting: Emergency Medicine

## 2019-05-03 DIAGNOSIS — Z79899 Other long term (current) drug therapy: Secondary | ICD-10-CM | POA: Insufficient documentation

## 2019-05-03 DIAGNOSIS — I10 Essential (primary) hypertension: Secondary | ICD-10-CM | POA: Insufficient documentation

## 2019-05-03 DIAGNOSIS — Z886 Allergy status to analgesic agent status: Secondary | ICD-10-CM | POA: Insufficient documentation

## 2019-05-03 DIAGNOSIS — R5383 Other fatigue: Secondary | ICD-10-CM

## 2019-05-03 DIAGNOSIS — R52 Pain, unspecified: Secondary | ICD-10-CM

## 2019-05-03 DIAGNOSIS — Z20822 Contact with and (suspected) exposure to covid-19: Secondary | ICD-10-CM

## 2019-05-03 DIAGNOSIS — R509 Fever, unspecified: Secondary | ICD-10-CM | POA: Diagnosis present

## 2019-05-03 DIAGNOSIS — U071 COVID-19: Secondary | ICD-10-CM | POA: Insufficient documentation

## 2019-05-03 MED ORDER — CYCLOBENZAPRINE HCL 10 MG PO TABS: 10.0000 mg | ORAL_TABLET | Freq: Two times a day (BID) | ORAL | 0 refills | Status: AC | PRN

## 2019-05-03 MED ORDER — ACETAMINOPHEN 500 MG PO TABS
1000.0000 mg | ORAL_TABLET | Freq: Once | ORAL | Status: AC
  Administered 2019-05-03: 11:00:00 1000 mg via ORAL

## 2019-05-03 MED ORDER — ONDANSETRON 4 MG PO TBDP: 4.0000 mg | ORAL_TABLET | Freq: Three times a day (TID) | ORAL | 0 refills | Status: AC | PRN

## 2019-05-03 MED ORDER — ACETAMINOPHEN 500 MG PO TABS
ORAL_TABLET | ORAL | Status: AC
  Administered 2019-05-03: 11:00:00 1000 mg via ORAL
  Filled 2019-05-03: qty 2

## 2019-05-03 MED FILL — ONDANSETRON ODT 4 MG TABLET: 4 | 6 days supply | Qty: 20 | Fill #0

## 2019-05-03 MED FILL — CYCLOBENZAPRINE HCL 10 MG T: 10 | 10 days supply | Qty: 20 | Fill #0

## 2019-05-03 NOTE — ED Triage Notes (Signed)
"  I think I have the flu".  Body aches, chills, fatigue x6 days.  Denis n/v/d.  Denies sinus sx or HA.  No known fever.

## 2019-05-03 NOTE — ED Provider Notes (Signed)
[facility] EMERGENCY DEPARTMENT Provider Note   CSN: [id number] Arrival date & time: 05/03/19  [id number]     History   Chief Complaint Chief Complaint  Patient presents with  . flu-like symptoms    HPI Sandra Cunningham is a 48 y.o. female.     HPI   Feeling subjective fever, fatigue, body aches, chills for 5 days No n/v/d Mild occ cough, not persistent NO shortness of breath No chest pain No black or bloody stools No loss of taste or smell APpetite is low, trying to eat No dysuria/abdominal pain  Past Medical History:  Diagnosis Date  . Anemia   . Hypertension     There are no active problems to display for this patient.   Past Surgical History:  Procedure Laterality Date  . ABDOMINAL HYSTERECTOMY    . CARPAL TUNNEL RELEASE    . CESAREAN SECTION       OB History   No obstetric history on file.      Home Medications    Prior to Admission medications   Medication Sig Start Date End Date Taking? Authorizing Provider  lisinopril (ZESTRIL) 5 MG tablet Take by mouth. 02/05/19  Yes [provider]  amLODipine (NORVASC) 10 MG tablet Take 10 mg by mouth daily.    [provider]  cyclobenzaprine (FLEXERIL) 10 MG tablet Take 1 tablet (10 mg total) by mouth 2 (two) times daily as needed for muscle spasms. 05/03/19   Gareth Morgan, MD  ondansetron (ZOFRAN ODT) 4 MG disintegrating tablet Take 1 tablet (4 mg total) by mouth every 8 (eight) hours as needed for nausea or vomiting. 05/03/19   Gareth Morgan, MD    Family History No family history on file.  Social History Social History   Tobacco Use  . Smoking status: Never Smoker  . Smokeless tobacco: Never Used  Substance Use Topics  . Alcohol use: No  . Drug use: No     Allergies   Tylenol [acetaminophen]   Review of Systems Review of Systems  Constitutional: Positive for appetite change, chills, fatigue and fever.  HENT: Negative for sore throat.   Eyes: Negative  for visual disturbance.  Respiratory: Negative for cough and shortness of breath.   Cardiovascular: Negative for chest pain.  Gastrointestinal: Negative for abdominal pain, diarrhea, nausea and vomiting.  Genitourinary: Negative for difficulty urinating.  Musculoskeletal: Positive for arthralgias and myalgias. Negative for back pain and neck pain.  Skin: Negative for rash.  Neurological: Negative for syncope and headaches.     Physical Exam Updated Vital Signs BP 129/80 (BP Location: Right Arm)   Pulse 83   Temp (!) 101.9 F (38.8 C) (Oral)   Resp (!) 22   Ht 5\' 5"  (1.651 m)   Wt 100.5 kg   SpO2 98%   BMI 36.88 kg/m   Physical Exam Vitals signs and nursing note reviewed.  Constitutional:      General: She is not in acute distress.    Appearance: She is well-developed. She is not diaphoretic.  HENT:     Head: Normocephalic and atraumatic.  Eyes:     Conjunctiva/sclera: Conjunctivae normal.  Neck:     Musculoskeletal: Normal range of motion.  Cardiovascular:     Rate and Rhythm: Normal rate and regular rhythm.     Heart sounds: Normal heart sounds. No murmur. No friction rub. No gallop.   Pulmonary:     Effort: Pulmonary effort is normal. No respiratory distress.  Breath sounds: Normal breath sounds. No wheezing or rales.  Abdominal:     General: There is no distension.     Palpations: Abdomen is soft.     Tenderness: There is no abdominal tenderness. There is no guarding.  Musculoskeletal:        General: No tenderness.  Skin:    General: Skin is warm and dry.     Findings: No erythema or rash.  Neurological:     Mental Status: She is alert and oriented to person, place, and time.      ED Treatments / Results  Labs (all labs ordered are listed, but only abnormal results are displayed) Labs Reviewed  NOVEL CORONAVIRUS, NAA (HOSP ORDER, SEND-OUT TO REF LAB; TAT 18-24 HRS) - Abnormal; Notable for the following components:      Result Value   SARS-CoV-2,  NAA DETECTED (*)    All other components within normal limits    EKG None  Radiology No results found.  Procedures Procedures (including critical care time)  Medications Ordered in ED Medications  acetaminophen (TYLENOL) tablet 1,000 mg (1,000 mg Oral Given 05/03/19 1127)     Initial Impression / Assessment and Plan / ED Course  I have reviewed the triage vital signs and the nursing notes.  Pertinent labs & imaging results that were available during my care of the patient were reviewed by me and considered in my medical decision making (see chart for details).        48yo female with hx of htn presents with concern for fever, fatigue, body aches, low appetite.  Temperature 100.1 on arrival to ED but noted increase to 101.9.  Given clinical hx, exam, duration of symptoms, clear lung sounds, do not suspect bacterial pneumonia at this time. No hx to suggest UTI, intraabdominal infection, no signs of strep throat.  High suspicion for viral process, specifically COVID19 in setting of pandemic. Ordered COVID testing, recommend continued supportive care, flexeril, zofran, tylenol/ibuprofen, rest and quarantine.  Discussed reasons to return to the ED in detail.   Sandra Cunningham was evaluated in Emergency Department on 05/05/2019 for the symptoms described in the history of present illness. She was evaluated in the context of the global COVID-19 pandemic, which necessitated consideration that the patient might be at risk for infection with the SARS-CoV-2 virus that causes COVID-19. Institutional protocols and algorithms that pertain to the evaluation of patients at risk for COVID-19 are in a state of rapid change based on information released by regulatory bodies including the CDC and federal and state organizations. These policies and algorithms were followed during the patient's care in the ED.    Final Clinical Impressions(s) / ED Diagnoses   Final diagnoses:  Suspected COVID-19 virus  infection  Body aches  Other fatigue    ED Discharge Orders         Ordered    cyclobenzaprine (FLEXERIL) 10 MG tablet  2 times daily PRN     05/03/19 1101    ondansetron (ZOFRAN ODT) 4 MG disintegrating tablet  Every 8 hours PRN     05/03/19 1101           Gareth Morgan, MD 05/05/19 [phone]

## 2019-05-03 NOTE — ED Notes (Signed)
Pt reports that tylenol upsets her stomach, states that she can take it. EDP aware.

## 2019-05-07 LAB — NOVEL CORONAVIRUS, NAA (HOSP ORDER, SEND-OUT TO REF LAB; TAT 18-24 HRS): SARS-CoV-2, NAA: DETECTED — AB

## 2021-06-15 ENCOUNTER — Other Ambulatory Visit: Payer: Self-pay

## 2021-06-15 ENCOUNTER — Emergency Department (HOSPITAL_BASED_OUTPATIENT_CLINIC_OR_DEPARTMENT_OTHER)
Admission: EM | Admit: 2021-06-15 | Discharge: 2021-06-15 | Disposition: A | Discharge status: Home / Self Care | Payer: BLUE CROSS/BLUE SHIELD | Attending: Emergency Medicine | Admitting: Emergency Medicine

## 2021-06-15 ENCOUNTER — Encounter (HOSPITAL_BASED_OUTPATIENT_CLINIC_OR_DEPARTMENT_OTHER): Payer: Self-pay | Admitting: Emergency Medicine

## 2021-06-15 DIAGNOSIS — Z79899 Other long term (current) drug therapy: Secondary | ICD-10-CM | POA: Insufficient documentation

## 2021-06-15 DIAGNOSIS — R519 Headache, unspecified: Secondary | ICD-10-CM | POA: Diagnosis not present

## 2021-06-15 DIAGNOSIS — Z20822 Contact with and (suspected) exposure to covid-19: Secondary | ICD-10-CM | POA: Insufficient documentation

## 2021-06-15 DIAGNOSIS — I10 Essential (primary) hypertension: Secondary | ICD-10-CM | POA: Diagnosis not present

## 2021-06-15 DIAGNOSIS — H5789 Other specified disorders of eye and adnexa: Secondary | ICD-10-CM | POA: Diagnosis not present

## 2021-06-15 LAB — RESP PANEL BY RT-PCR (FLU A&B, COVID) ARPGX2
Influenza A by PCR: NEGATIVE
Influenza B by PCR: NEGATIVE
SARS Coronavirus 2 by RT PCR: NEGATIVE

## 2021-06-15 MED ORDER — PROCHLORPERAZINE MALEATE 10 MG PO TABS: 10.0000 mg | ORAL_TABLET | Freq: Two times a day (BID) | ORAL | 0 refills | Status: AC | PRN

## 2021-06-15 MED ORDER — SODIUM CHLORIDE 0.9 % IV BOLUS
1000.0000 mL | Freq: Once | INTRAVENOUS | Status: AC
  Administered 2021-06-15: 20:00:00 1000 mL via INTRAVENOUS

## 2021-06-15 MED ORDER — DIPHENHYDRAMINE HCL 50 MG/ML IJ SOLN
50.0000 mg | Freq: Once | INTRAMUSCULAR | Status: AC
  Administered 2021-06-15: 20:00:00 50 mg via INTRAVENOUS
  Filled 2021-06-15: qty 1

## 2021-06-15 MED ORDER — PROCHLORPERAZINE EDISYLATE 10 MG/2ML IJ SOLN
10.0000 mg | Freq: Once | INTRAMUSCULAR | Status: AC
  Administered 2021-06-15: 20:00:00 10 mg via INTRAVENOUS
  Filled 2021-06-15: qty 2

## 2021-06-15 NOTE — Discharge Instructions (Signed)
Your history, exam, work-up today were overall reassuring.  You did not have any focal neurologic deficits on our exam and together we had a shared decision-making conversation and agreed to hold on imaging and give you medications to help your symptoms initially.  As your headache is completely resolved, with a reassuring exam, we still feel it is reasonable to hold on labs or CT imaging.  Your COVID and flu test were negative.  Given the history of migraines and some migrainous aspects of the headache with the light and sound sensitivity, we still feel you are safe for discharge home at this time.  Please rest and stay hydrated and follow-up with your primary doctor.  If any symptoms change or worsen acutely, please return to the nearest emergency department.

## 2021-06-15 NOTE — ED Provider Notes (Signed)
[facility] EMERGENCY DEPT Provider Note   CSN: [id number] Arrival date & time: 06/15/21  1535     History Chief Complaint  Patient presents with   Headache    Sandra Cunningham is a 50 y.o. female.  The history is provided by the patient and medical records.  Headache Pain location:  Frontal Quality:  Dull Radiates to:  Does not radiate Severity currently:  8/10 Severity at highest:  8/10 Onset quality:  Gradual Timing:  Constant Progression:  Worsening Chronicity:  New Similar to prior headaches: yes   Context: bright light and loud noise   Context: not activity and not coughing   Relieved by:  Nothing Worsened by:  Nothing Ineffective treatments:  None tried Associated symptoms: photophobia   Associated symptoms: no abdominal pain, no back pain, no blurred vision, no congestion, no cough, no diarrhea, no dizziness, no fatigue, no fever, no focal weakness, no loss of balance, no nausea, no neck pain, no neck stiffness, no numbness, no paresthesias, no seizures, no syncope, no URI, no visual change, no vomiting and no weakness       Past Medical History:  Diagnosis Date   Anemia    Hypertension     There are no problems to display for this patient.   Past Surgical History:  Procedure Laterality Date   ABDOMINAL HYSTERECTOMY     CARPAL TUNNEL RELEASE     CESAREAN SECTION       OB History   No obstetric history on file.     History reviewed. No pertinent family history.  Social History   Tobacco Use   Smoking status: Never   Smokeless tobacco: Never  Substance Use Topics   Alcohol use: No    Comment: occasionally   Drug use: No    Home Medications Prior to Admission medications   Medication Sig Start Date End Date Taking? Authorizing Provider  amLODipine (NORVASC) 10 MG tablet Take 10 mg by mouth daily.    [provider]  cyclobenzaprine (FLEXERIL) 10 MG tablet Take 1 tablet (10 mg total) by mouth 2 (two) times daily as  needed for muscle spasms. 05/03/19   Gareth Morgan, MD  lisinopril (ZESTRIL) 5 MG tablet Take by mouth. 02/05/19   [provider]  ondansetron (ZOFRAN ODT) 4 MG disintegrating tablet Take 1 tablet (4 mg total) by mouth every 8 (eight) hours as needed for nausea or vomiting. 05/03/19   Gareth Morgan, MD    Allergies    Tylenol [acetaminophen]  Review of Systems   Review of Systems  Constitutional:  Negative for chills, fatigue and fever.  HENT:  Negative for congestion.   Eyes:  Positive for photophobia. Negative for blurred vision and visual disturbance.  Respiratory:  Negative for cough, chest tightness, shortness of breath, wheezing and stridor.   Cardiovascular:  Negative for chest pain and syncope.  Gastrointestinal:  Negative for abdominal pain, constipation, diarrhea, nausea and vomiting.  Musculoskeletal:  Negative for back pain, neck pain and neck stiffness.  Skin:  Negative for rash and wound.  Neurological:  Positive for headaches. Negative for dizziness, focal weakness, seizures, speech difficulty, weakness, light-headedness, numbness, paresthesias and loss of balance.  Psychiatric/Behavioral:  Negative for agitation and confusion.    Physical Exam Updated Vital Signs BP 120/78   Pulse 61   Temp 98.6 F (37 C) (Oral)   Resp 18   Ht 5\' 5"  (1.651 m)   Wt 92.1 kg   SpO2 100%   BMI 33.78  kg/m   Physical Exam Vitals and nursing note reviewed.  Constitutional:      General: She is not in acute distress.    Appearance: She is well-developed. She is not ill-appearing, toxic-appearing or diaphoretic.  HENT:     Head: Normocephalic and atraumatic.     Nose: No congestion or rhinorrhea.     Mouth/Throat:     Mouth: Mucous membranes are moist.     Pharynx: No oropharyngeal exudate or posterior oropharyngeal erythema.  Eyes:     Extraocular Movements: Extraocular movements intact.     Conjunctiva/sclera: Conjunctivae normal.     Pupils: Pupils are equal,  round, and reactive to light.  Neck:     Vascular: No carotid bruit.  Cardiovascular:     Rate and Rhythm: Normal rate and regular rhythm.     Heart sounds: No murmur heard. Pulmonary:     Effort: Pulmonary effort is normal. No respiratory distress.     Breath sounds: Normal breath sounds. No wheezing, rhonchi or rales.  Chest:     Chest wall: No tenderness.  Abdominal:     General: Abdomen is flat.     Palpations: Abdomen is soft.     Tenderness: There is no abdominal tenderness. There is no right CVA tenderness, left CVA tenderness, guarding or rebound.  Musculoskeletal:        General: No swelling or tenderness.     Cervical back: Neck supple. No tenderness.  Skin:    General: Skin is warm and dry.     Capillary Refill: Capillary refill takes less than 2 seconds.     Findings: No erythema.  Neurological:     General: No focal deficit present.     Mental Status: She is alert. Mental status is at baseline.     Sensory: No sensory deficit.     Motor: No weakness.     Coordination: Coordination normal.  Psychiatric:        Mood and Affect: Mood normal.    ED Results / Procedures / Treatments   Labs (all labs ordered are listed, but only abnormal results are displayed) Labs Reviewed  RESP PANEL BY RT-PCR (FLU A&B, COVID) [id number]    EKG None  Radiology No results found.  Procedures Procedures   Medications Ordered in ED Medications - No data to display  ED Course  I have reviewed the triage vital signs and the nursing notes.  Pertinent labs & imaging results that were available during my care of the patient were reviewed by me and considered in my medical decision making (see chart for details).    MDM Rules/Calculators/A&P                           Sandra Cunningham is a 50 y.o. female with a past medical history significant for prior migraines, hypertension, anemia who presents with headache.  Patient reports that since Friday, she has been having headache  that is moderate to severe and throbbing in her head.  She reports it is more frontal and does not go to the her neck.  She denies fevers, chills or infectious symptoms.  No congestion, cough, chest pain, palpitations.  No nausea, vomiting, constipation, diarrhea, urinary changes.  Denies any speech difficulties or other neurologic complaints.  She reports he does a history of migraines but has not had one in some time.  She reports some photophobia and phonophobia.  Denies trauma.  On exam, lungs clear  and chest nontender.  Abdomen nontender.  Patient had normal sensation and strength in extremities, normal finger-nose-finger testing, symmetric smile.  Clear speech.  Pupils are symmetric and reactive normal extraocular movements.  No nuchal rigidity.  No other other abnormalities on exam.  Had a long shared decision-making conversation with patient.  Patient does report that her grandmother has history of aneurysm however patient thinks this could be more related to a migraine then other cause at this time.  We had a shared decision-making conversation and offered labs and CTA of the head to look for aneurysm or bleed given the sudden nature of the headache however patient would rather try medications first.  Patient was given headache cocktail and had complete resolution of headache.  She still denies any other complaints.  Given the resolution, we feel it reasonable to let her go home and follow-up with the PCP however if any symptoms were to change or worsen or to have any neurologic complaints, she would likely to return for imaging and patient agrees.  Low suspicion for subarachnoid, subdural, aneurysm, bleed, dural venous sinus thrombosis, meningitis, or other intracranial abnormality.  Given her resolution of symptoms and well appearance, patient discharged in good condition with understanding of follow-up instructions and return precautions.   Final Clinical Impression(s) / ED Diagnoses Final  diagnoses:  Nonintractable headache, unspecified chronicity pattern, unspecified headache type    Rx / DC Orders ED Discharge Orders          Ordered    prochlorperazine (COMPAZINE) 10 MG tablet  2 times daily PRN        06/15/21 2146           Clinical Impression: 1. Nonintractable headache, unspecified chronicity pattern, unspecified headache type     Disposition: Discharge  Condition: Good  I have discussed the results, Dx and Tx plan with the pt(& family if present). He/she/they expressed understanding and agree(s) with the plan. Discharge instructions discussed at great length. Strict return precautions discussed and pt &/or family have verbalized understanding of the instructions. No further questions at time of discharge.    Discharge Medication List as of 06/15/2021  9:48 PM     START taking these medications   Details  prochlorperazine (COMPAZINE) 10 MG tablet Take 1 tablet (10 mg total) by mouth 2 (two) times daily as needed for nausea or vomiting., Starting Mon 06/15/2021, Print        Follow Up: Lorenda Hatchet, [address] [place] [address] [facility] [place] [address] [phone]       Itza Maniaci, Gwenyth Allegra, MD 06/15/21 2356

## 2021-06-15 NOTE — ED Triage Notes (Signed)
Pt via pov from home with headache since Friday. She did telemed appointment and the provider told her to come to ED to make sure it is not more than a migraine. Pt states she has hx of migraines, but that it has been more than 2 years since she had one. Pt alert & oriented, denies any other symptoms. NAD noted.

## 2023-10-24 ENCOUNTER — Encounter (HOSPITAL_COMMUNITY): Payer: Self-pay

## 2023-10-24 ENCOUNTER — Emergency Department (HOSPITAL_COMMUNITY)

## 2023-10-24 ENCOUNTER — Emergency Department (HOSPITAL_COMMUNITY)
Admission: EM | Admit: 2023-10-24 | Discharge: 2023-10-24 | Disposition: A | Discharge status: Home / Self Care | Attending: Emergency Medicine | Admitting: Emergency Medicine

## 2023-10-24 ENCOUNTER — Other Ambulatory Visit: Payer: Self-pay

## 2023-10-24 DIAGNOSIS — R1033 Periumbilical pain: Secondary | ICD-10-CM

## 2023-10-24 DIAGNOSIS — N3000 Acute cystitis without hematuria: Secondary | ICD-10-CM | POA: Diagnosis not present

## 2023-10-24 DIAGNOSIS — Z79899 Other long term (current) drug therapy: Secondary | ICD-10-CM | POA: Insufficient documentation

## 2023-10-24 DIAGNOSIS — I10 Essential (primary) hypertension: Secondary | ICD-10-CM | POA: Diagnosis not present

## 2023-10-24 LAB — COMPREHENSIVE METABOLIC PANEL WITH GFR
ALT: 14 U/L (ref 0–44)
AST: 21 U/L (ref 15–41)
Albumin: 3.2 g/dL — ABNORMAL LOW (ref 3.5–5.0)
Alkaline Phosphatase: 44 U/L (ref 38–126)
Anion gap: 13 (ref 5–15)
BUN: 19 mg/dL (ref 6–20)
CO2: 21 mmol/L — ABNORMAL LOW (ref 22–32)
Calcium: 9 mg/dL (ref 8.9–10.3)
Chloride: 108 mmol/L (ref 98–111)
Creatinine, Ser: 1.2 mg/dL — ABNORMAL HIGH (ref 0.44–1.00)
GFR, Estimated: 53 mL/min — ABNORMAL LOW (ref >60)
Glucose, Bld: 122 mg/dL — ABNORMAL HIGH (ref 70–99)
Potassium: 2.9 mmol/L — ABNORMAL LOW (ref 3.5–5.1)
Sodium: 142 mmol/L (ref 135–145)
Total Bilirubin: 0.5 mg/dL (ref 0.0–1.2)
Total Protein: 6.4 g/dL — ABNORMAL LOW (ref 6.5–8.1)

## 2023-10-24 LAB — URINALYSIS, W/ REFLEX TO CULTURE (INFECTION SUSPECTED)
Bilirubin Urine: NEGATIVE
Glucose, UA: NEGATIVE mg/dL
Hgb urine dipstick: NEGATIVE
Ketones, ur: NEGATIVE mg/dL
Nitrite: NEGATIVE
Protein, ur: NEGATIVE mg/dL
Specific Gravity, Urine: 1.026 (ref 1.005–1.030)
pH: 6 (ref 5.0–8.0)

## 2023-10-24 LAB — CBC WITH DIFFERENTIAL/PLATELET
Abs Immature Granulocytes: 0.03 10*3/uL (ref 0.00–0.07)
Basophils Absolute: 0 10*3/uL (ref 0.0–0.1)
Basophils Relative: 0 %
Eosinophils Absolute: 0 10*3/uL (ref 0.0–0.5)
Eosinophils Relative: 0 %
HCT: 40.1 % (ref 36.0–46.0)
Hemoglobin: 12.7 g/dL (ref 12.0–15.0)
Immature Granulocytes: 1 %
Lymphocytes Relative: 27 %
Lymphs Abs: 1.6 10*3/uL (ref 0.7–4.0)
MCH: 27.7 pg (ref 26.0–34.0)
MCHC: 31.7 g/dL (ref 30.0–36.0)
MCV: 87.6 fL (ref 80.0–100.0)
Monocytes Absolute: 0.3 10*3/uL (ref 0.1–1.0)
Monocytes Relative: 5 %
Neutro Abs: 4 10*3/uL (ref 1.7–7.7)
Neutrophils Relative %: 67 %
Platelets: 210 10*3/uL (ref 150–400)
RBC: 4.58 MIL/uL (ref 3.87–5.11)
RDW: 15.1 % (ref 11.5–15.5)
WBC: 5.9 10*3/uL (ref 4.0–10.5)
nRBC: 0 % (ref 0.0–0.2)

## 2023-10-24 LAB — I-STAT CHEM 8, ED
BUN: 19 mg/dL (ref 6–20)
Calcium, Ion: 1.15 mmol/L (ref 1.15–1.40)
Chloride: 106 mmol/L (ref 98–111)
Creatinine, Ser: 1.1 mg/dL — ABNORMAL HIGH (ref 0.44–1.00)
Glucose, Bld: 115 mg/dL — ABNORMAL HIGH (ref 70–99)
HCT: 39 % (ref 36.0–46.0)
Hemoglobin: 13.3 g/dL (ref 12.0–15.0)
Potassium: 2.8 mmol/L — ABNORMAL LOW (ref 3.5–5.1)
Sodium: 144 mmol/L (ref 135–145)
TCO2: 24 mmol/L (ref 22–32)

## 2023-10-24 LAB — LIPASE, BLOOD: Lipase: 65 U/L — ABNORMAL HIGH (ref 11–51)

## 2023-10-24 MED ORDER — CEPHALEXIN 250 MG PO CAPS
500.0000 mg | ORAL_CAPSULE | Freq: Once | ORAL | Status: AC
  Administered 2023-10-24: 500 mg via ORAL
  Filled 2023-10-24: qty 2

## 2023-10-24 MED ORDER — DIPHENHYDRAMINE HCL 50 MG/ML IJ SOLN
25.0000 mg | Freq: Once | INTRAMUSCULAR | Status: AC
Start: 2023-10-24 — End: 2023-10-24
  Administered 2023-10-24: 25 mg via INTRAVENOUS
  Filled 2023-10-24: qty 1

## 2023-10-24 MED ORDER — MORPHINE SULFATE (PF) 4 MG/ML IV SOLN
4.0000 mg | Freq: Once | INTRAVENOUS | Status: AC
  Administered 2023-10-24: 4 mg via INTRAVENOUS
  Filled 2023-10-24: qty 1

## 2023-10-24 MED ORDER — ONDANSETRON HCL 4 MG/2ML IJ SOLN
4.0000 mg | Freq: Four times a day (QID) | INTRAMUSCULAR | Status: DC | PRN
  Administered 2023-10-24: 4 mg via INTRAVENOUS
  Filled 2023-10-24: qty 2

## 2023-10-24 MED ORDER — CEPHALEXIN 500 MG PO CAPS: 500.0000 mg | ORAL_CAPSULE | Freq: Two times a day (BID) | ORAL | 0 refills | Status: DC

## 2023-10-24 MED ORDER — POTASSIUM CHLORIDE 10 MEQ/100ML IV SOLN
10.0000 meq | INTRAVENOUS | Status: AC
  Administered 2023-10-24 (×2): 10 meq via INTRAVENOUS
  Filled 2023-10-24 (×2): qty 100

## 2023-10-24 MED ORDER — POTASSIUM CHLORIDE CRYS ER 20 MEQ PO TBCR
40.0000 meq | EXTENDED_RELEASE_TABLET | Freq: Once | ORAL | Status: AC
  Administered 2023-10-24: 40 meq via ORAL
  Filled 2023-10-24: qty 2

## 2023-10-24 MED ORDER — IOHEXOL 350 MG/ML SOLN
75.0000 mL | Freq: Once | INTRAVENOUS | Status: AC | PRN
  Administered 2023-10-24: 75 mL via INTRAVENOUS

## 2023-10-24 MED ORDER — CEPHALEXIN 500 MG PO CAPS: 500.0000 mg | ORAL_CAPSULE | Freq: Two times a day (BID) | ORAL | 0 refills | Status: AC

## 2023-10-24 NOTE — ED Triage Notes (Signed)
 Pt bib ems from home c/o fever. Pt states she was hot and clammy to the touch. Pt woke up around 4 am to eat cheese and crackers. Pt felt itchy and face swelling. EMS didn't notice any swelling but endorses pt feeling warm to touch.   650 Tylenol  BP 170/143 HR 83 RR 16 RA 100  CBG 172

## 2023-10-24 NOTE — ED Notes (Signed)
 Pt states she is having muscle spasms in her right leg.

## 2023-10-24 NOTE — ED Provider Notes (Signed)
 [place] EMERGENCY DEPARTMENT AT [facility] Provider Note   CSN: [id number] Arrival date & time: 10/24/23  1191     History  Chief Complaint  Patient presents with   Fever   Abdominal Pain    Sandra Cunningham is a 53 y.o. female with PMHx anemia, HTN, chronic back pain who presents to ED concerned for umbilical abdominal pain since 4AM this morning after eating cheese and crackers. Patient initially thought that it was d/t needing to have a BM, so she went to bathroom and did have a normal BM but symptom persisted. Patient then started feeling feverish and itchy. Patient also endorsing an episode of SOB which she believes was d/t anxiety given her pain. SOB episode was short lived. Patient denies eating new foods or recent new medications or other allergan exposure. Patient has taken Tylenol given by EMS - no other medications for symptoms today.  Denies chest pain, cough, nausea, vomiting, diarrhea, dysuria, hematuria, hematochezia.   HPI     Home Medications Prior to Admission medications   Medication Sig Start Date End Date Taking? Authorizing Provider  cephALEXin (KEFLEX) 500 MG capsule Take 1 capsule (500 mg total) by mouth 2 (two) times daily for 7 days. 10/24/23 10/31/23 Yes Valrie Hart F, PA-C  amLODipine (NORVASC) 10 MG tablet Take 10 mg by mouth daily.    [provider]  cyclobenzaprine (FLEXERIL) 10 MG tablet Take 1 tablet (10 mg total) by mouth 2 (two) times daily as needed for muscle spasms. 05/03/19   Alvira Monday, MD  lisinopril (ZESTRIL) 5 MG tablet Take by mouth. 02/05/19   [provider]  ondansetron (ZOFRAN ODT) 4 MG disintegrating tablet Take 1 tablet (4 mg total) by mouth every 8 (eight) hours as needed for nausea or vomiting. 05/03/19   Alvira Monday, MD  prochlorperazine (COMPAZINE) 10 MG tablet Take 1 tablet (10 mg total) by mouth 2 (two) times daily as needed for nausea or vomiting. 06/15/21   Tegeler, Canary Brim,  MD      Allergies    Tylenol [acetaminophen]    Review of Systems   Review of Systems  Gastrointestinal:  Positive for abdominal pain.    Physical Exam Updated Vital Signs BP (!) 136/93   Pulse 62   Temp 98.2 F (36.8 C) (Oral)   Resp 20   Ht 5\' 5"  (1.651 m)   Wt 90.7 kg   SpO2 100%   BMI 33.28 kg/m  Physical Exam Vitals and nursing note reviewed.  Constitutional:      General: She is not in acute distress.    Appearance: She is not ill-appearing or toxic-appearing.  HENT:     Head: Normocephalic and atraumatic.     Mouth/Throat:     Mouth: Mucous membranes are moist.     Pharynx: No posterior oropharyngeal erythema.  Eyes:     General: No scleral icterus.       Right eye: No discharge.        Left eye: No discharge.     Conjunctiva/sclera: Conjunctivae normal.  Cardiovascular:     Rate and Rhythm: Normal rate and regular rhythm.     Pulses: Normal pulses.     Heart sounds: Normal heart sounds. No murmur heard. Pulmonary:     Effort: Pulmonary effort is normal. No respiratory distress.     Breath sounds: Normal breath sounds. No wheezing, rhonchi or rales.  Abdominal:     General: Abdomen is flat. Bowel sounds are normal.  There is no distension.     Palpations: Abdomen is soft. There is no mass.     Tenderness: There is abdominal tenderness in the periumbilical area.  Musculoskeletal:     Right lower leg: No edema.     Left lower leg: No edema.  Skin:    General: Skin is warm and dry.     Findings: No rash.  Neurological:     General: No focal deficit present.     Mental Status: She is alert and oriented to person, place, and time. Mental status is at baseline.  Psychiatric:        Mood and Affect: Mood normal.     ED Results / Procedures / Treatments   Labs (all labs ordered are listed, but only abnormal results are displayed) Labs Reviewed  COMPREHENSIVE METABOLIC PANEL WITH GFR - Abnormal; Notable for the following components:      Result Value    Potassium 2.9 (*)    CO2 21 (*)    Glucose, Bld 122 (*)    Creatinine, Ser 1.20 (*)    Total Protein 6.4 (*)    Albumin 3.2 (*)    GFR, Estimated 53 (*)    All other components within normal limits  LIPASE, BLOOD - Abnormal; Notable for the following components:   Lipase 65 (*)    All other components within normal limits  URINALYSIS, W/ REFLEX TO CULTURE (INFECTION SUSPECTED) - Abnormal; Notable for the following components:   APPearance HAZY (*)    Leukocytes,Ua MODERATE (*)    Bacteria, UA RARE (*)    All other components within normal limits  I-STAT CHEM 8, ED - Abnormal; Notable for the following components:   Potassium 2.8 (*)    Creatinine, Ser 1.10 (*)    Glucose, Bld 115 (*)    All other components within normal limits  CBC WITH DIFFERENTIAL/PLATELET    EKG EKG Interpretation Date/Time:  Monday October 24 2023 11:02:39 EDT Ventricular Rate:  68 PR Interval:  164 QRS Duration:  88 QT Interval:  409 QTC Calculation: 435 R Axis:   23  Text Interpretation: Sinus rhythm Borderline T abnormalities, anterior leads when compared to prior, new q wave in lead 3 No STEMI Confirmed by Theda Belfast ([id number]) on 10/24/2023 11:08:54 AM  Radiology CT ABDOMEN PELVIS W CONTRAST Result Date: 10/24/2023 CLINICAL DATA:  Abdominal pain, acute, nonlocalized. EXAM: CT ABDOMEN AND PELVIS WITH CONTRAST TECHNIQUE: Multidetector CT imaging of the abdomen and pelvis was performed using the standard protocol following bolus administration of intravenous contrast. RADIATION DOSE REDUCTION: This exam was performed according to the departmental dose-optimization program which includes automated exposure control, adjustment of the mA and/or kV according to patient size and/or use of iterative reconstruction technique. CONTRAST:  75mL OMNIPAQUE IOHEXOL 350 MG/ML SOLN COMPARISON:  CT scan abdomen and pelvis report from 12/21/2012. Please note images are not available for review at the time of dictation.  FINDINGS: Lower chest: There are patchy atelectatic changes in the visualized lung bases. No overt consolidation. No pleural effusion. The heart is normal in size. No pericardial effusion. Hepatobiliary: The liver is normal in size. Non-cirrhotic configuration. No suspicious mass. No intrahepatic or extrahepatic bile duct dilation. No calcified gallstones. Normal gallbladder wall thickness. No pericholecystic inflammatory changes. Pancreas: Unremarkable. No pancreatic ductal dilatation or surrounding inflammatory changes. Spleen: Within normal limits. No focal lesion. Adrenals/Urinary Tract: Adrenal glands are unremarkable. No suspicious renal mass. There are 2 simple cysts in the left kidney with largest  in the interpolar region, anteriorly measuring 4.3 x 4.6 cm. There is a subcentimeter hypoattenuating focus in the right kidney upper pole, posteriorly, which is too small to adequately characterize. There is small area of focal scarring noted in the right kidney lower pole, posteriorly which exhibits a small focus of dystrophic calcification as well. No nephroureterolithiasis or obstructive uropathy. Unremarkable urinary bladder. Stomach/Bowel: No disproportionate dilation of the small or large bowel loops. The appendix is unremarkable. There is mild circumferential thickening of the descending colon with mild pericolonic fat stranding, in appropriate clinical settings may represent early/subacute colitis. No associated abscess or collection. No pneumoperitoneum, pneumatosis or portal venous gas. Vascular/Lymphatic: No ascites or pneumoperitoneum. No abdominal or pelvic lymphadenopathy, by size criteria. No aneurysmal dilation of the major abdominal arteries. Reproductive: The uterus is surgically absent. No large adnexal mass. Other: The visualized soft tissues and abdominal wall are unremarkable. Musculoskeletal: No suspicious osseous lesions. L5-S1 posterior spinal fixation noted. IMPRESSION: 1. Mild  circumferential thickening of the descending colon with mild pericolonic fat stranding, in appropriate clinical settings may represent early/subacute colitis. No associated abscess or collection. 2. Multiple other nonacute observations, as described above. Electronically Signed   By: Jules Schick M.D.   On: 10/24/2023 10:28    Procedures Procedures    Medications Ordered in ED Medications  ondansetron (ZOFRAN) injection 4 mg (4 mg Intravenous Given 10/24/23 0801)  diphenhydrAMINE (BENADRYL) injection 25 mg (25 mg Intravenous Given 10/24/23 0800)  morphine (PF) 4 MG/ML injection 4 mg (4 mg Intravenous Given 10/24/23 0803)  potassium chloride 10 mEq in 100 mL IVPB (0 mEq Intravenous Stopped 10/24/23 1221)  potassium chloride SA (KLOR-CON M) CR tablet 40 mEq (40 mEq Oral Given 10/24/23 0950)  iohexol (OMNIPAQUE) 350 MG/ML injection 75 mL (75 mLs Intravenous Contrast Given 10/24/23 0935)  cephALEXin (KEFLEX) capsule 500 mg (500 mg Oral Given 10/24/23 1243)    ED Course/ Medical Decision Making/ A&P                                 Medical Decision Making Amount and/or Complexity of Data Reviewed Labs: ordered. Radiology: ordered.  Risk Prescription drug management.    This patient presents to the ED for concern of abdominal pain, this involves an extensive number of treatment options, and is a complaint that carries with it a high risk of complications and morbidity.  The differential diagnosis includes gastroenteritis, colitis, small bowel obstruction, appendicitis, cholecystitis, pancreatitis, nephrolithiasis, UTI, pyleonephritis, ruptured ectopic pregnancy, PID, ovarian torsion.   Co morbidities that complicate the patient evaluation  anemia, HTN, chronic back pain   Additional history obtained:  Dr. Katrinka Blazing PCP   Problem List / ED Course / Critical interventions / Medication management  Patient presented for periumbilical abdominal pain. Also with generalized pruritus and a short  lived episode of SOB. Physical exam with periumbilical tenderness to palpation. Patient also itching her arms. Rest of physical exam reassuring. Patient afebrile with stable vitals. No fever documented in ED. I Ordered, and personally interpreted labs.  Lipase mildly elevated at 65.  CMP with hypokalemia 2.9.  Creatinine is also mildly elevated at 1.20.  CBC without leukocytosis or anemia.  UA concerning for UTI with moderate leukocytes, rare bacteria. I ordered imaging studies including CT Abd/Pelvis with contrast: evaluate for structural/surgical etiology of patients' severe abdominal pain.  I independently visualized and interpreted imaging and I agree with the radiologist interpretation of possible mild colitis -  no other acute process today. I am less concerned for colitis at this point given patient's lack of fever, leukocytosis and diarrhea/vomiting. Provided patient with oral and IV potassium supplementation which she tolerated well. Also provided patient with one dose of Morphine and Benadyl for her pain and itching when she first arrived - patient stating that she feels better. Patient tolerating PO intake in ED. Patient tolerated her first dose of keflex well. Will send rest of UTI regimen to pharmacy. Urine culture sent. I am less concerned for pyelonephritis at this time given stable vitals and lack of leukocytosis. Shared all results with patient and family at bedside. Answered all questions. Patient stating that she feels better. Patient ready for discharge. Recommended following up with PCP. I have reviewed the patients home medicines and have made adjustments as needed Staffed with Dr. Rush Landmark. Patient was given return precautions. Patient stable for discharge at this time.  Patient verbalized understanding of plan.  Ddx: These are considered less likely due to history of present illness and physical exam. -gastroenteritis: No vomiting in ED; no fever; tolerating PO intake -colitis:  Denies diarrhea, patient afebrile  -small bowel obstruction: Last BM today  -appendicitis: CT reassuring -cholecystitis: Negative Murphy sign; liver enzymes within normal limits  -pancreatitis: No LUQ tenderness to palpation, CT reassuring, patient tolerating PO  -nephrolithiasis: Denies flank pain and urinary complaints    Social Determinants of Health:  None          Final Clinical Impression(s) / ED Diagnoses Final diagnoses:  Acute cystitis without hematuria  Periumbilical abdominal pain    Rx / DC Orders ED Discharge Orders          Ordered    cephALEXin (KEFLEX) 500 MG capsule  2 times daily        10/24/23 1249              Dorthy Cooler, [place] 10/24/23 1256    Tegeler, Canary Brim, MD 10/24/23 [phone]

## 2023-10-24 NOTE — ED Notes (Signed)
 Pt provided graham crackers with medication

## 2023-10-24 NOTE — ED Notes (Signed)
 Pt ambulated to restroom without incident.

## 2023-10-24 NOTE — ED Notes (Signed)
 Patient transported to CT

## 2023-10-24 NOTE — Discharge Instructions (Addendum)
 It was a pleasure caring for you today.  Please follow-up with your primary care provider.  Seek emergency care experiencing any new or worsening symptoms.

## 2024-08-08 ENCOUNTER — Ambulatory Visit: Admitting: Family

## 2024-08-08 ENCOUNTER — Other Ambulatory Visit: Payer: Self-pay | Admitting: Family

## 2024-08-08 VITALS — BP 129/85 | HR 79 | Ht 65.0 in | Wt 213.0 lb

## 2024-08-08 DIAGNOSIS — M549 Dorsalgia, unspecified: Secondary | ICD-10-CM | POA: Diagnosis not present

## 2024-08-08 DIAGNOSIS — R635 Abnormal weight gain: Secondary | ICD-10-CM | POA: Diagnosis not present

## 2024-08-08 DIAGNOSIS — F32A Depression, unspecified: Secondary | ICD-10-CM

## 2024-08-08 DIAGNOSIS — F418 Other specified anxiety disorders: Secondary | ICD-10-CM | POA: Diagnosis not present

## 2024-08-08 DIAGNOSIS — Z7689 Persons encountering health services in other specified circumstances: Secondary | ICD-10-CM

## 2024-08-08 DIAGNOSIS — I10 Essential (primary) hypertension: Secondary | ICD-10-CM | POA: Diagnosis not present

## 2024-08-08 DIAGNOSIS — G8929 Other chronic pain: Secondary | ICD-10-CM | POA: Diagnosis not present

## 2024-08-08 DIAGNOSIS — G4700 Insomnia, unspecified: Secondary | ICD-10-CM

## 2024-08-08 MED ORDER — MELOXICAM 7.5 MG PO TABS: 7.5000 mg | ORAL_TABLET | Freq: Every day | ORAL | 0 refills | Status: AC

## 2024-08-08 MED ORDER — TIRZEPATIDE-WEIGHT MANAGEMENT 2.5 MG/0.5ML ~~LOC~~ SOLN: 2.5000 mg | SUBCUTANEOUS | 0 refills | Status: DC

## 2024-08-08 MED ORDER — TIRZEPATIDE-WEIGHT MANAGEMENT 2.5 MG/0.5ML ~~LOC~~ SOLN: 2.5000 mg | SUBCUTANEOUS | 0 refills | Status: AC

## 2024-08-08 MED ORDER — AMLODIPINE BESYLATE 10 MG PO TABS: 10.0000 mg | ORAL_TABLET | Freq: Every day | ORAL | 0 refills | Status: AC

## 2024-08-08 MED ORDER — BACLOFEN 10 MG PO TABS: 10.0000 mg | ORAL_TABLET | Freq: Two times a day (BID) | ORAL | 2 refills | Status: AC

## 2024-08-08 MED ORDER — TRAZODONE HCL 100 MG PO TABS: 100.0000 mg | ORAL_TABLET | Freq: Every day | ORAL | 0 refills | Status: AC

## 2024-08-08 NOTE — Progress Notes (Signed)
 "  Subjective:    Sandra Cunningham - 54 y.o. female MRN [id number]  Date of birth: 04-23-1971  HPI  Sandra Cunningham is to establish care.   Current issues and/or concerns: - Doing well on Amlodipine , no issues/concerns. She does not complain of red flag symptoms such as but not limited to chest pain, shortness of breath, worst headache of life, nausea/vomiting.  - Anxiety depression. She denies thoughts of self-harm, suicidal ideations, homicidal ideations. - Insomnia. Doing well on Trazodone  100 mg, no issues/concerns.  - Chronic back pain persisting. Denies recent trauma/injury and red flag symptoms. Doing well on Meloxicam  and Baclofen , no issues/concerns. States in the past she was supposed to have back surgery and wasn't ready at the time. States she would like referral to back specialist. - States would like to try Zepbound  vial to help lose weight. She is watching what she eats. Exercise limited by chronic back pain.  ROS per HPI   Health Maintenance:  Health Maintenance Due  Topic Date Due   HIV Screening  Never done   Hepatitis C Screening  Never done   DTaP/Tdap/Td (1 - Tdap) Never done   Hepatitis B Vaccines 19-59 Average Risk (1 of 3 - 19+ 3-dose series) Never done   Cervical Cancer Screening (HPV/Pap Cotest)  Never done   Mammogram  Never done   Colonoscopy  Never done   Pneumococcal Vaccine: 50+ Years (1 of 1 - PCV) Never done   Zoster Vaccines- Shingrix (1 of 2) Never done   Influenza Vaccine  02/17/2024   COVID-19 Vaccine (3 - 2025-26 season) 03/19/2024     Past Medical History: There are no active problems to display for this patient.     Social History   reports that she has never smoked. She has never used smokeless tobacco. She reports that she does not drink alcohol and does not use drugs.   Family History  family history is not on file.   Medications: reviewed and updated   Objective:   Physical Exam BP 129/85   Pulse 79   Ht 5' 5 (1.651 m)    Wt 213 lb (96.6 kg)   SpO2 97%   BMI 35.45 kg/m   Physical Exam HENT:     Head: Normocephalic and atraumatic.     Nose: Nose normal.     Mouth/Throat:     Mouth: Mucous membranes are moist.     Pharynx: Oropharynx is clear.  Eyes:     Extraocular Movements: Extraocular movements intact.     Conjunctiva/sclera: Conjunctivae normal.     Pupils: Pupils are equal, round, and reactive to light.  Cardiovascular:     Rate and Rhythm: Normal rate and regular rhythm.     Pulses: Normal pulses.     Heart sounds: Normal heart sounds.  Pulmonary:     Effort: Pulmonary effort is normal.     Breath sounds: Normal breath sounds.  Musculoskeletal:        General: Normal range of motion.     Cervical back: Normal, normal range of motion and neck supple.     Thoracic back: Normal.     Lumbar back: Normal.  Neurological:     General: No focal deficit present.     Mental Status: She is alert and oriented to person, place, and time.  Psychiatric:        Mood and Affect: Mood normal.        Behavior: Behavior normal.  Assessment & Plan:  1. Encounter to establish care (Primary) - Patient presents today to establish care. During the interim follow-up with primary provider as scheduled.  - Return for annual physical examination, labs, and health maintenance. Arrive fasting meaning having no food for at least 8 hours prior to appointment. You may have only water or black coffee. Please take scheduled medications as normal.  2. Primary hypertension - Continue Amlodipine  as prescribed.  - Routine screening.  - Counseled on blood pressure goal of less than 130/80, low-sodium, DASH diet, medication compliance, and 150 minutes of moderate intensity exercise per week as tolerated. Counseled on medication adherence and adverse effects. - Follow-up with primary provider in 3 months or sooner if needed.  - amLODipine  (NORVASC ) 10 MG tablet; Take 1 tablet (10 mg total) by mouth daily.  Dispense: 90  tablet; Refill: 0 - Basic Metabolic Panel  3. Anxiety and depression - Patient denies thoughts of self-harm, suicidal ideations, homicidal ideations. - Patient declined pharmacological therapy.  - Patient declined referral to Psychiatry.  - Follow-up with primary provider as scheduled.   4. Insomnia, unspecified type - Continue Trazodone  as prescribed. Counseled on medication adherence/adverse effects.  - Follow-up with primary provider in 3 months or sooner if needed.  - traZODone  (DESYREL ) 100 MG tablet; Take 1 tablet (100 mg total) by mouth at bedtime.  Dispense: 90 tablet; Refill: 0  5. Chronic back pain, unspecified back location, unspecified back pain laterality - Continue Baclofen  and Meloxicam  as prescribed. Counseled on medication adherence/adverse effects.  - Referral to Orthopedic Surgery for evaluation/management.  - Follow-up with primary provider as scheduled.  - baclofen  (LIORESAL ) 10 MG tablet; Take 1 tablet (10 mg total) by mouth 2 (two) times daily.  Dispense: 60 each; Refill: 2 - meloxicam  (MOBIC ) 7.5 MG tablet; Take 1 tablet (7.5 mg total) by mouth daily.  Dispense: 90 tablet; Refill: 0 - Ambulatory referral to Orthopedic Surgery  6. Weight gain - Tirzepatide  as prescribed. Counseled on medication adherence/adverse effects.  - Routine screening.  - Follow-up with primary provider in 4 weeks or sooner if needed. - tirzepatide  (ZEPBOUND ) 2.5 MG/0.5ML injection vial; Inject 2.5 mg into the skin once a week.  Dispense: 2 mL; Refill: 0 - TSH     Patient was given clear instructions to go to Emergency Department or return to medical center if symptoms don't improve, worsen, or new problems develop.The patient verbalized understanding.  I discussed the assessment and treatment plan with the patient. The patient was provided an opportunity to ask questions and all were answered. The patient agreed with the plan and demonstrated an understanding of the instructions.    The patient was advised to call back or seek an in-person evaluation if the symptoms worsen or if the condition fails to improve as anticipated.    Greig Chute, NP 08/08/2024, 11:34 AM [facility]  "

## 2024-08-13 ENCOUNTER — Other Ambulatory Visit: Payer: Self-pay

## 2024-08-20 ENCOUNTER — Ambulatory Visit: Admitting: Physical Medicine and Rehabilitation

## 2024-08-30 ENCOUNTER — Ambulatory Visit: Admitting: Physical Medicine and Rehabilitation

## 2024-09-07 ENCOUNTER — Ambulatory Visit: Payer: Self-pay | Admitting: Family

## 2024-09-10 ENCOUNTER — Ambulatory Visit: Payer: Self-pay | Admitting: Family
